# Patient Record
Sex: Male | Born: 1960 | Race: White | Hispanic: No | Marital: Single | Smoking: Current every day smoker
Health system: Southern US, Community
[De-identification: ages and names within clinical notes are randomized; demographics above are authoritative.]

## PROBLEM LIST (undated history)

## (undated) DIAGNOSIS — I10 Essential (primary) hypertension: Secondary | ICD-10-CM

## (undated) DIAGNOSIS — J449 Chronic obstructive pulmonary disease, unspecified: Secondary | ICD-10-CM

## (undated) DIAGNOSIS — F17200 Nicotine dependence, unspecified, uncomplicated: Secondary | ICD-10-CM

## (undated) HISTORY — PX: KNEE SURGERY: SHX244

## (undated) HISTORY — DX: Nicotine dependence, unspecified, uncomplicated: F17.200

## (undated) HISTORY — DX: Essential (primary) hypertension: I10

## (undated) HISTORY — DX: Chronic obstructive pulmonary disease, unspecified: J44.9

---

## 2013-10-15 ENCOUNTER — Emergency Department (INDEPENDENT_AMBULATORY_CARE_PROVIDER_SITE_OTHER): Payer: Self-pay

## 2013-10-15 ENCOUNTER — Encounter: Payer: Self-pay | Admitting: Emergency Medicine

## 2013-10-15 ENCOUNTER — Emergency Department
Admission: EM | Admit: 2013-10-15 | Discharge: 2013-10-15 | Disposition: A | Discharge status: Home / Self Care | Payer: Self-pay | Source: Home / Self Care | Attending: Emergency Medicine | Admitting: Emergency Medicine

## 2013-10-15 DIAGNOSIS — J209 Acute bronchitis, unspecified: Secondary | ICD-10-CM

## 2013-10-15 DIAGNOSIS — Z720 Tobacco use: Secondary | ICD-10-CM

## 2013-10-15 DIAGNOSIS — R509 Fever, unspecified: Secondary | ICD-10-CM

## 2013-10-15 DIAGNOSIS — R05 Cough: Secondary | ICD-10-CM

## 2013-10-15 LAB — POCT INFLUENZA A/B
INFLUENZA A, POC: NEGATIVE
INFLUENZA B, POC: NEGATIVE

## 2013-10-15 MED ORDER — AZITHROMYCIN 250 MG PO TABS: ORAL_TABLET | ORAL | Status: DC

## 2013-10-15 MED ORDER — HYDROCODONE-HOMATROPINE 5-1.5 MG/5ML PO SYRP: 5.0000 mL | ORAL_SOLUTION | Freq: Four times a day (QID) | ORAL | Status: DC | PRN

## 2013-10-15 NOTE — ED Notes (Signed)
Pt c/o cough, congestion, fever and body aches x 4 days.

## 2013-10-15 NOTE — Discharge Instructions (Signed)

## 2013-10-15 NOTE — ED Provider Notes (Signed)
CSN: [id number]     Arrival date & time 10/15/13  1209 History   First MD Initiated Contact with Patient 10/15/13 1242     Chief Complaint  Patient presents with  . Cough  . Fever   (Consider location/radiation/quality/duration/timing/severity/associated sxs/prior Treatment) Patient is a 53 y.o. male presenting with cough and fever. The history is provided by the patient. No language interpreter was used.  Cough Cough characteristics:  Productive Sputum characteristics:  Green Severity:  Moderate Timing:  Constant Progression:  Worsening Chronicity:  New Smoker: no   Context: upper respiratory infection   Worsened by:  Nothing tried Ineffective treatments:  None tried Associated symptoms: fever   Risk factors: no recent infection   Fever Associated symptoms: cough     History reviewed. No pertinent past medical history. Past Surgical History  Procedure Laterality Date  . Knee surgery Right    Family History  Problem Relation Age of Onset  . Family history unknown: Yes   History  Substance Use Topics  . Smoking status: Current Every Day Smoker -- 1.00 packs/day    Types: Cigarettes  . Smokeless tobacco: Not on file  . Alcohol Use: No    Review of Systems  Constitutional: Positive for fever.  Respiratory: Positive for cough.   All other systems reviewed and are negative.   Allergies  Review of patient's allergies indicates no known allergies.  Home Medications   Prior to Admission medications   Medication Sig Start Date End Date Taking? Authorizing Provider  albuterol (PROVENTIL HFA;VENTOLIN HFA) 108 (90 BASE) MCG/ACT inhaler Inhale into the lungs every 6 (six) hours as needed for wheezing or shortness of breath.   Yes Historical Provider, MD  benzonatate (TESSALON) 100 MG capsule Take by mouth 3 (three) times daily as needed for cough.   Yes Historical Provider, MD  ibuprofen (ADVIL,MOTRIN) 200 MG tablet Take 200 mg by mouth every 6 (six) hours as needed.    Yes Historical Provider, MD  lidocaine (XYLOCAINE) 2 % solution Use as directed 20 mLs in the mouth or throat as needed for mouth pain.   Yes Historical Provider, MD   BP 114/76  Pulse 75  Temp(Src) 98.1 F (36.7 C) (Oral)  Resp 16  Ht 6' (1.829 m)  Wt 249 lb (112.946 kg)  BMI 33.76 kg/m2  SpO2 96% Physical Exam  Nursing note and vitals reviewed. Constitutional: He is oriented to person, place, and time. He appears well-developed and well-nourished.  HENT:  Head: Normocephalic.  Eyes: EOM are normal. Pupils are equal, round, and reactive to light.  Neck: Normal range of motion.  Pulmonary/Chest: Effort normal. He exhibits tenderness.  Abdominal: Soft. He exhibits no distension.  Musculoskeletal: Normal range of motion.  Neurological: He is alert and oriented to person, place, and time.  Skin: Skin is warm.  Psychiatric: He has a normal mood and affect.    ED Course  Procedures (including critical care time) Labs Review Labs Reviewed - No data to display  Imaging Review Dg Chest 2 View  10/15/2013   CLINICAL DATA:  Cough, fever, and congestion for 3 days.  Smoker.  EXAM: CHEST  2 VIEW  COMPARISON:  None.  FINDINGS: The heart size and mediastinal contours are within normal limits. Both lungs are clear. The visualized skeletal structures are unremarkable.  IMPRESSION: No active cardiopulmonary disease.   Electronically Signed   By: Davonna BellingJohn  Curnes M.D.   On: 10/15/2013 13:15     MDM   1. Acute bronchitis, unspecified  organism    zithromax hydromet Continue albuterol Return if any problems.    Lonia SkinnerLeslie K Crystal LakeSofia, PA-C 10/15/13 1342

## 2013-10-18 NOTE — ED Provider Notes (Signed)
Medical history/examination/treatment/procedure(s) were performed by non-physician provider and as supervising physician I was immediately available for consultation/collaboration.  Lajean Manesavid Massey, MD 10/18/13 1124

## 2015-10-16 ENCOUNTER — Emergency Department
Admission: EM | Admit: 2015-10-16 | Discharge: 2015-10-16 | Disposition: A | Discharge status: Home / Self Care | Payer: Self-pay | Source: Home / Self Care | Attending: Family Medicine | Admitting: Family Medicine

## 2015-10-16 ENCOUNTER — Encounter: Payer: Self-pay | Admitting: Emergency Medicine

## 2015-10-16 DIAGNOSIS — R202 Paresthesia of skin: Secondary | ICD-10-CM

## 2015-10-16 DIAGNOSIS — R030 Elevated blood-pressure reading, without diagnosis of hypertension: Secondary | ICD-10-CM

## 2015-10-16 DIAGNOSIS — R51 Headache: Secondary | ICD-10-CM

## 2015-10-16 DIAGNOSIS — R200 Anesthesia of skin: Secondary | ICD-10-CM

## 2015-10-16 DIAGNOSIS — R519 Headache, unspecified: Secondary | ICD-10-CM

## 2015-10-16 LAB — COMPLETE METABOLIC PANEL WITH GFR
ALT: 17 U/L (ref 9–46)
AST: 17 U/L (ref 10–35)
Albumin: 4.1 g/dL (ref 3.6–5.1)
Alkaline Phosphatase: 59 U/L (ref 40–115)
BUN: 16 mg/dL (ref 7–25)
CO2: 23 mmol/L (ref 20–31)
Calcium: 9.4 mg/dL (ref 8.6–10.3)
Chloride: 105 mmol/L (ref 98–110)
Creat: 1.02 mg/dL (ref 0.70–1.33)
GFR, Est African American: >89 mL/min (ref >=60)
GFR, Est Non African American: 81 mL/min (ref >=60)
Glucose, Bld: 97 mg/dL (ref 65–99)
Potassium: 5 mmol/L (ref 3.5–5.3)
Sodium: 137 mmol/L (ref 135–146)
Total Bilirubin: 0.5 mg/dL (ref 0.2–1.2)
Total Protein: 6.7 g/dL (ref 6.1–8.1)

## 2015-10-16 LAB — POCT CBC W AUTO DIFF (K'VILLE URGENT CARE)

## 2015-10-16 LAB — POCT URINALYSIS DIP (MANUAL ENTRY)
Bilirubin, UA: NEGATIVE
Blood, UA: NEGATIVE
Glucose, UA: NEGATIVE
Ketones, POC UA: NEGATIVE
Leukocytes, UA: NEGATIVE
Nitrite, UA: NEGATIVE
Protein Ur, POC: NEGATIVE
Spec Grav, UA: 1.02 (ref 1.005–1.03)
Urobilinogen, UA: NEGATIVE (ref 0–1)
pH, UA: 5.5 (ref 5–8)

## 2015-10-16 LAB — TSH: TSH: 0.95 mIU/L (ref 0.40–4.50)

## 2015-10-16 MED ORDER — IBUPROFEN 600 MG PO TABS
600.0000 mg | ORAL_TABLET | Freq: Once | ORAL | Status: AC
  Administered 2015-10-16: 600 mg via ORAL

## 2015-10-16 MED ORDER — LISINOPRIL 10 MG PO TABS: 10.0000 mg | ORAL_TABLET | Freq: Every day | ORAL | 0 refills | Status: DC

## 2015-10-16 NOTE — ED Triage Notes (Signed)
Pt c/o HA, facial numbness and left arm pain that started x2 days ago. States pain is constant and he is having nausea. Denies chest pain.

## 2015-10-16 NOTE — ED Provider Notes (Signed)
CSN: [id number]     Arrival date & time 10/16/15  [id number] History   First MD Initiated Contact with Patient 10/16/15 0820     Chief Complaint  Patient presents with  . Headache   (Consider location/radiation/quality/duration/timing/severity/associated sxs/prior Treatment) HPI  Eric Baird is a 55 y.o. male presenting to UC with c/o Left sided facial numbness and Left arm numbness that started yesterday while he was watching the news, associated generalized headache that was slow in onset, mild to moderate in severity.  He did take ibuprofen yesterday, which helped with his headache.  He reports having a mild headache now but has not had anything for pain PTA. Denies weakness or pain to his face or arm.  He did have a mild head cold the other day but congestion has resolved.  No other symptoms. Denies dizziness, change in vision or balance. Denies chest pain or SOB.  Pt notes he knows he has elevated blood pressure due to readings at a pharmacy which told him he was "at risk" but he does not have a PCP. He is not on any daily medication. He has not f/u with a PCP in over 20 years. Pt is adopted so he does not know his family history.    No past medical history on file. Past Surgical History:  Procedure Laterality Date  . KNEE SURGERY Right    Family History  Problem Relation Age of Onset  . Family history unknown: Yes   Social History  Substance Use Topics  . Smoking status: Current Every Day Smoker    Packs/day: 1.00    Types: Cigarettes  . Smokeless tobacco: Not on file  . Alcohol use No    Review of Systems  Constitutional: Negative for chills and fever.  HENT: Negative for congestion, ear pain, sore throat, trouble swallowing and voice change.   Eyes: Negative for photophobia, pain and visual disturbance.  Respiratory: Negative for cough, chest tightness and shortness of breath.   Cardiovascular: Negative for chest pain and palpitations.  Gastrointestinal: Negative for  abdominal pain, diarrhea, nausea and vomiting.  Musculoskeletal: Negative for arthralgias, back pain, gait problem, myalgias, neck pain and neck stiffness.  Skin: Negative for rash.  Neurological: Positive for numbness and headaches. Negative for dizziness, syncope, speech difficulty, weakness and light-headedness.    Allergies  Review of patient's allergies indicates no known allergies.  Home Medications   Prior to Admission medications   Medication Sig Start Date End Date Taking? Authorizing Provider  albuterol (PROVENTIL HFA;VENTOLIN HFA) 108 (90 BASE) MCG/ACT inhaler Inhale into the lungs every 6 (six) hours as needed for wheezing or shortness of breath.    Historical Provider, MD  azithromycin (ZITHROMAX Z-PAK) 250 MG tablet Two tablets on day 1 then one tablet a day 10/15/13   Elson AreasLeslie K Sofia, PA-C  benzonatate (TESSALON) 100 MG capsule Take by mouth 3 (three) times daily as needed for cough.    Historical Provider, MD  HYDROcodone-homatropine (HYDROMET) 5-1.5 MG/5ML syrup Take 5 mLs by mouth every 6 (six) hours as needed for cough. 10/15/13   Elson AreasLeslie K Sofia, PA-C  ibuprofen (ADVIL,MOTRIN) 200 MG tablet Take 200 mg by mouth every 6 (six) hours as needed.    Historical Provider, MD  lidocaine (XYLOCAINE) 2 % solution Use as directed 20 mLs in the mouth or throat as needed for mouth pain.    Historical Provider, MD  lisinopril (PRINIVIL,ZESTRIL) 10 MG tablet Take 1 tablet (10 mg total) by mouth daily. 10/16/15   Denny PeonErin  Gershon Mussel, PA-C   Meds Ordered and Administered this Visit   Medications  ibuprofen (ADVIL,MOTRIN) tablet 600 mg (600 mg Oral Given 10/16/15 0858)    There were no vitals taken for this visit. No data found.   Physical Exam  Constitutional: He is oriented to person, place, and time. He appears well-developed and well-nourished. No distress.  Pt sitting on exam bed, appears well, NAD. Smiling, cooperative during exam.  HENT:  Head: Normocephalic and atraumatic.   Right Ear: Tympanic membrane normal.  Left Ear: Tympanic membrane normal.  Nose: Nose normal.  Mouth/Throat: Uvula is midline, oropharynx is clear and moist and mucous membranes are normal.  Eyes: Conjunctivae and EOM are normal. Pupils are equal, round, and reactive to light. No scleral icterus.  Neck: Normal range of motion. Neck supple.  No midline bone tenderness, no crepitus or step-offs.   Cardiovascular: Normal rate, regular rhythm, normal heart sounds and intact distal pulses.   Pulmonary/Chest: Effort normal and breath sounds normal. No respiratory distress. He has no wheezes. He has no rales.  Abdominal: Soft. He exhibits no distension and no mass. There is no tenderness.  Musculoskeletal: Normal range of motion.  Neurological: He is alert and oriented to person, place, and time. He has normal strength. No cranial nerve deficit or sensory deficit. He displays a negative Romberg sign. Coordination and gait normal. GCS eye subscore is 4. GCS verbal subscore is 5. GCS motor subscore is 6.  Reflex Scores:      Patellar reflexes are 2+ on the right side and 2+ on the left side. CN II-XII in tact. Speech is clear, alert to person, place and time. Normal finger to nose coordination. Normal gait.   Skin: Skin is warm and dry. He is not diaphoretic.  Nursing note and vitals reviewed.   Urgent Care Course   Clinical Course    Procedures (including critical care time)  Labs Review Labs Reviewed  COMPLETE METABOLIC PANEL WITH GFR  TSH  POCT CBC W AUTO DIFF ([facility])  POCT URINALYSIS DIP (MANUAL ENTRY)    Imaging Review No results found.   MDM   1. Numbness and tingling of left side of face   2. Left arm numbness   3. Generalized headache   4. Elevated blood pressure reading    Pt c/o numbness and tingling to Left side of face and Left arm that started yesterday, associated headache that was gradual in onset. Hx of headaches in the past. HA last night resolved  with ibuprofen. Ibuprofen given to pt per his request in UC for mild headache. BP is elevated. Pt is aware he has elevated BP but does not have a PCP.    Normal neuro exam today.  Doubt SAH, CVA, or other emergent process taking place at this time. CBC and UA: WNL CMP and TSH pending Will start pt on Lisinopril 10mg  daily- 20 tabs until he can f/u with a PCP. Encouraged to monitor his BP. Resource guide for PCP provided. Discussed symptoms that warrant emergent care in the ED. Pt info packet provided. Patient verbalized understanding and agreement with treatment plan.     Junius Finner, PA-C 10/16/15 [phone]

## 2015-10-17 ENCOUNTER — Telehealth: Payer: Self-pay | Admitting: Emergency Medicine

## 2015-10-17 NOTE — Telephone Encounter (Signed)
Home and mobile numbers do not work, called work number and he is out of office.

## 2015-10-20 ENCOUNTER — Telehealth: Payer: Self-pay | Admitting: Emergency Medicine

## 2015-10-20 NOTE — Telephone Encounter (Signed)
Patient called in gave me the correct mobile number, labs normal gave him phone number to establish care with Primary Care

## 2015-11-03 ENCOUNTER — Ambulatory Visit (INDEPENDENT_AMBULATORY_CARE_PROVIDER_SITE_OTHER): Payer: Self-pay | Admitting: Osteopathic Medicine

## 2015-11-03 ENCOUNTER — Encounter: Payer: Self-pay | Admitting: Osteopathic Medicine

## 2015-11-03 VITALS — BP 125/85 | HR 75 | Ht 72.0 in | Wt 246.0 lb

## 2015-11-03 DIAGNOSIS — F17200 Nicotine dependence, unspecified, uncomplicated: Secondary | ICD-10-CM

## 2015-11-03 DIAGNOSIS — F17209 Nicotine dependence, unspecified, with unspecified nicotine-induced disorders: Secondary | ICD-10-CM | POA: Insufficient documentation

## 2015-11-03 DIAGNOSIS — I10 Essential (primary) hypertension: Secondary | ICD-10-CM

## 2015-11-03 HISTORY — DX: Essential (primary) hypertension: I10

## 2015-11-03 HISTORY — DX: Nicotine dependence, unspecified, uncomplicated: F17.200

## 2015-11-03 MED ORDER — BUPROPION HCL ER (SR) 150 MG PO TB12: 150.0000 mg | ORAL_TABLET | Freq: Two times a day (BID) | ORAL | 1 refills | Status: DC

## 2015-11-03 MED ORDER — LISINOPRIL 10 MG PO TABS: 10.0000 mg | ORAL_TABLET | Freq: Every day | ORAL | 1 refills | Status: DC

## 2015-11-03 NOTE — Progress Notes (Signed)
HPI: Eric BelfastRichard Baird is a 55 y.o. male  [facility], 11/[facility]/15,  for chief complaint of:  Chief Complaint  Patient presents with  . Establish Care    New patient here to establish care, referred from urgent care  Hypertension: Patient recently started on lisinopril by urgent care, blood pressure is under good control today. Denies chest pain, pressure, shortness of breath, vision changes, headache.  Tobacco dependence: Patient is interested in quitting, nicotine replacement therapy has not been helpful, he has been able to stop cold [place] but always seems to restart shortly thereafter. He is open to medication augmentation.  Pulmonary: Occasional use of albuterol, no known diagnosis of COPD/asthma, albuterol was prescribed for acute illness at some point per patient. He reports no difficulty breathing which inhibits his day-to-day activities, no shortness of breath likely   Past medical, surgical, social and family history reviewed: No past medical history on file. Past Surgical History:  Procedure Laterality Date  . KNEE SURGERY Right    Social History  Substance Use Topics  . Smoking status: Current Every Day Smoker    Packs/day: 1.00    Types: Cigarettes  . Smokeless tobacco: Never Used  . Alcohol use No   Family History  Problem Relation Age of Onset  . Family history unknown: Yes     Current medication list and allergy/intolerance information reviewed:   Current Outpatient Prescriptions on File Prior to Visit  Medication Sig Dispense Refill  . albuterol (PROVENTIL HFA;VENTOLIN HFA) 108 (90 BASE) MCG/ACT inhaler Inhale into the lungs every 6 (six) hours as needed for wheezing or shortness of breath.    . lisinopril (PRINIVIL,ZESTRIL) 10 MG tablet Take 1 tablet (10 mg total) by mouth daily. 20 tablet 0   No current facility-administered medications on file prior to visit.    No Known Allergies    Review of  Systems:  Constitutional: No recent illness  HEENT: No  headache, no vision change  Cardiac: No  chest pain, No  pressure, No palpitations  Respiratory:  No  shortness of breath. No  Cough  Gastrointestinal: No  abdominal pain, no change on bowel habits  Musculoskeletal: No new myalgia/arthralgia  Skin: No  Rash  Hem/Onc: No  easy bruising/bleeding, No  abnormal lumps/bumps  Neurologic: No  weakness, No  Dizziness  Psychiatric: No  concerns with depression, No  concerns with anxiety  Exam:  BP 125/85   Pulse 75   Ht 6' (1.829 m)   Wt 246 lb (111.6 kg)   BMI 33.36 kg/m   Constitutional: VS see above. General Appearance: alert, well-developed, well-nourished, NAD  Eyes: Normal lids and conjunctive, non-icteric sclera  Ears, Nose, Mouth, Throat: MMM, Normal external inspection ears/nares/mouth/lips/gums.  Neck: No masses, trachea midline.   Respiratory: Normal respiratory effort. no wheeze, no rhonchi, no rales  Cardiovascular: S1/S2 normal, no murmur, no rub/gallop auscultated. RRR.   Musculoskeletal: Gait normal. Symmetric and independent movement of all extremities  Neurological: Normal balance/coordination. No tremor.  Skin: warm, dry, intact.   Psychiatric: Normal judgment/insight. Normal mood and affect. Oriented x3.      ASSESSMENT/PLAN: Advised we need labs to follow kidney function on new blood pressure medication and for her sugar for diabetes screening, also need cholesterol screening. Patient will come back for fasting labs. Discussed Chantix versus Wellbutrin as good options for him for medication management to help quit smoking, unfortunately due to lack of insurance Chantix is out of his reach at  this time opts to try Wellbutrin, if not successful and this will contact drug rep to see if there is any savings program which may benefit him. Patient has quit date set for Christmas this year  Essential hypertension - Plan: Lipid panel, BASIC METABOLIC  PANEL WITH GFR, lisinopril (PRINIVIL,ZESTRIL) 10 MG tablet  Tobacco dependence - Plan: buPROPion (WELLBUTRIN SR) 150 MG 12 hr tablet     Visit summary with medication list and pertinent instructions was printed for patient to review. All questions at time of visit were answered - patient instructed to contact office with any additional concerns. ER/RTC precautions were reviewed with the patient. Follow-up plan: Return for visit w/ Dr Lyn HollingsheadAlexander in 1 year for Annual Check-up and refills, visit in 6 months with nurse for BP.  Note: Total time spent 30 minutes, greater than 50% of the visit was spent face-to-face counseling and coordinating care for the following: The primary encounter diagnosis was Essential hypertension. A diagnosis of Tobacco dependence was also pertinent to this visit..[name]

## 2015-11-08 LAB — LIPID PANEL
CHOLESTEROL: 203 mg/dL — AB (ref 125–200)
HDL: 36 mg/dL — AB (ref >=40)
LDL Cholesterol: 147 mg/dL — ABNORMAL HIGH (ref <130)
Total CHOL/HDL Ratio: 5.6 Ratio — ABNORMAL HIGH (ref <=5.0)
Triglycerides: 102 mg/dL (ref <150)
VLDL: 20 mg/dL (ref <30)

## 2015-11-08 LAB — BASIC METABOLIC PANEL WITH GFR
BUN: 11 mg/dL (ref 7–25)
CALCIUM: 9.5 mg/dL (ref 8.6–10.3)
CHLORIDE: 106 mmol/L (ref 98–110)
CO2: 23 mmol/L (ref 20–31)
Creat: 1.18 mg/dL (ref 0.70–1.33)
GFR, Est African American: 79 mL/min (ref >=60)
GFR, Est Non African American: 68 mL/min (ref >=60)
GLUCOSE: 81 mg/dL (ref 65–99)
Potassium: 4.4 mmol/L (ref 3.5–5.3)
Sodium: 137 mmol/L (ref 135–146)

## 2015-12-16 ENCOUNTER — Ambulatory Visit (INDEPENDENT_AMBULATORY_CARE_PROVIDER_SITE_OTHER): Payer: Self-pay | Admitting: Family Medicine

## 2015-12-16 ENCOUNTER — Encounter: Payer: Self-pay | Admitting: Family Medicine

## 2015-12-16 VITALS — BP 120/75 | HR 72 | Temp 98.0°F | Wt 245.0 lb

## 2015-12-16 DIAGNOSIS — J209 Acute bronchitis, unspecified: Secondary | ICD-10-CM

## 2015-12-16 DIAGNOSIS — F17200 Nicotine dependence, unspecified, uncomplicated: Secondary | ICD-10-CM

## 2015-12-16 DIAGNOSIS — Z23 Encounter for immunization: Secondary | ICD-10-CM

## 2015-12-16 DIAGNOSIS — J019 Acute sinusitis, unspecified: Secondary | ICD-10-CM

## 2015-12-16 MED ORDER — DOXYCYCLINE HYCLATE 100 MG PO TABS: 100.0000 mg | ORAL_TABLET | Freq: Two times a day (BID) | ORAL | 0 refills | Status: DC

## 2015-12-16 MED ORDER — PREDNISONE 20 MG PO TABS: 40.0000 mg | ORAL_TABLET | Freq: Every day | ORAL | 0 refills | Status: DC

## 2015-12-16 NOTE — Patient Instructions (Signed)
With the albuterol can use 2-4 puffs every 6 hours as needed. Call if not better in one week.

## 2015-12-16 NOTE — Progress Notes (Signed)
   Subjective:    Patient ID: Eric Baird, male    DOB: 04/10/1960, 55 y.o.   MRN: [id number]  HPI 55 year old male with a history of hypertension and tobacco abuse comes in today with chest and nasal congestion 3 days. Infectious started running a fever this morning. He did not measure it but says he was feeling hot to touch but yet had chills. He took some Advil. He is a very heavy smoker normally smokes 2 packs a day but more recently on the Wellbutrin he's been able to cut back down to one pack a day.  He has not had a flu vaccine this year. He denies any sore throat or headache. He just feels like he is getting worse and is actually feeling dizzy and nauseated this morning. No diarrhea or loose stools.   Review of Systems     Objective:   Physical Exam  Constitutional: He is oriented to person, place, and time. He appears well-developed and well-nourished.  HENT:  Head: Normocephalic and atraumatic.  Right Ear: External ear normal.  Left Ear: External ear normal.  Nose: Nose normal.  Mouth/Throat: Oropharynx is clear and moist.  TMs and canals are clear.   Eyes: Conjunctivae and EOM are normal. Pupils are equal, round, and reactive to light.  Neck: Neck supple. No thyromegaly present.  Cardiovascular: Normal rate and normal heart sounds.   Pulmonary/Chest: Effort normal. He has wheezes.  Diffuse wheezing and rhonchi.   Lymphadenopathy:    He has no cervical adenopathy.  Neurological: He is alert and oriented to person, place, and time.  Skin: Skin is warm and dry. No rash noted.  Psychiatric: He has a normal mood and affect. His behavior is normal.       Assessment & Plan:  Acute sinusitis/bronchitis-I strongly suspect he has some underlying COPD. Has some significant wheezing on exam today. Thus we'll treat with doxycycline and prednisone almost like a COPD exacerbation. We'll also give him an albuterol sample. Used 2-4 puffs every 6 hours as needed. If he is not feeling  much better after week then he needs to come in for follow-up. He really needs to be scheduled for spirometry sometime in January to evaluate for underlying COPD.  Tobacco abuse-he is working on cutting back.

## 2016-01-13 ENCOUNTER — Ambulatory Visit (INDEPENDENT_AMBULATORY_CARE_PROVIDER_SITE_OTHER): Payer: Self-pay | Admitting: Osteopathic Medicine

## 2016-01-13 VITALS — BP 109/85 | HR 79 | Ht 72.0 in | Wt 251.0 lb

## 2016-01-13 DIAGNOSIS — J449 Chronic obstructive pulmonary disease, unspecified: Secondary | ICD-10-CM

## 2016-01-13 DIAGNOSIS — F17200 Nicotine dependence, unspecified, uncomplicated: Secondary | ICD-10-CM

## 2016-01-13 DIAGNOSIS — J40 Bronchitis, not specified as acute or chronic: Secondary | ICD-10-CM

## 2016-01-13 MED ORDER — BUPROPION HCL ER (XL) 300 MG PO TB24: 300.0000 mg | ORAL_TABLET | Freq: Every day | ORAL | 1 refills | Status: DC

## 2016-01-13 MED ORDER — ALBUTEROL SULFATE (2.5 MG/3ML) 0.083% IN NEBU
2.5000 mg | INHALATION_SOLUTION | Freq: Once | RESPIRATORY_TRACT | Status: AC
  Administered 2016-01-13: 2.5 mg via RESPIRATORY_TRACT

## 2016-01-13 NOTE — Patient Instructions (Addendum)
Steps to Quit Smoking Smoking tobacco can be harmful to your health and can affect almost every organ in your body. Smoking puts you, and those around you, at risk for developing many serious chronic diseases. Quitting smoking is difficult, but it is one of the best things that you can do for your health. It is never too late to quit. What are the benefits of quitting smoking? When you quit smoking, you lower your risk of developing serious diseases and conditions, such as:  Lung cancer or lung disease, such as COPD.  Heart disease.  Stroke.  Heart attack.  Infertility.  Osteoporosis and bone fractures. Additionally, symptoms such as coughing, wheezing, and shortness of breath may get better when you quit. You may also find that you get sick less often because your body is stronger at fighting off colds and infections. If you are pregnant, quitting smoking can help to reduce your chances of having a baby of low birth weight. How do I get ready to quit? When you decide to quit smoking, create a plan to make sure that you are successful. Before you quit:  Pick a date to quit. Set a date within the next two weeks to give you time to prepare.  Write down the reasons why you are quitting. Keep this list in places where you will see it often, such as on your bathroom mirror or in your car or wallet.  Identify the people, places, things, and activities that make you want to smoke (triggers) and avoid them. Make sure to take these actions:  Throw away all cigarettes at home, at work, and in your car.  Throw away smoking accessories, such as Scientist, research (medical).  Clean your car and make sure to empty the ashtray.  Clean your home, including curtains and carpets.  Tell your family, friends, and coworkers that you are quitting. Support from your loved ones can make quitting easier.  Talk with your health care provider about your options for quitting smoking.  Find out what treatment  options are covered by your health insurance. What strategies can I use to quit smoking? Talk with your healthcare provider about different strategies to quit smoking. Some strategies include:  Quitting smoking altogether instead of gradually lessening how much you smoke over a period of time. Research shows that quitting "cold [place]" is more successful than gradually quitting.  Attending in-person counseling to help you build problem-solving skills. You are more likely to have success in quitting if you attend several counseling sessions. Even short sessions of 10 minutes can be effective.  Finding resources and support systems that can help you to quit smoking and remain smoke-free after you quit. These resources are most helpful when you use them often. They can include:  Online chats with a Social worker.  Telephone quitlines.  Printed Furniture conservator/restorer.  Support groups or group counseling.  Text messaging programs.  Mobile phone applications.  Taking medicines to help you quit smoking. (If you are pregnant or breastfeeding, talk with your health care provider first.) Some medicines contain nicotine and some do not. Both types of medicines help with cravings, but the medicines that include nicotine help to relieve withdrawal symptoms. Your health care provider may recommend:  Nicotine patches, gum, or lozenges.  Nicotine inhalers or sprays.  Non-nicotine medicine that is taken by mouth. Talk with your health care provider about combining strategies, such as taking medicines while you are also receiving in-person counseling. Using these two strategies together makes you more  likely to succeed in quitting than if you used either strategy on its own. If you are pregnant or breastfeeding, talk with your health care provider about finding counseling or other support strategies to quit smoking. Do not take medicine to help you quit smoking unless told to do so by your health care  provider. What things can I do to make it easier to quit? Quitting smoking might feel overwhelming at first, but there is a lot that you can do to make it easier. Take these important actions:  Reach out to your family and friends and ask that they support and encourage you during this time. Call telephone quitlines, reach out to support groups, or work with a counselor for support.  Ask people who smoke to avoid smoking around you.  Avoid places that trigger you to smoke, such as bars, parties, or smoke-break areas at work.  Spend time around people who do not smoke.  Lessen stress in your life, because stress can be a smoking trigger for some people. To lessen stress, try:  Exercising regularly.  Deep-breathing exercises.  Yoga.  Meditating.  Performing a body scan. This involves closing your eyes, scanning your body from head to toe, and noticing which parts of your body are particularly tense. Purposefully relax the muscles in those areas.  Download or purchase mobile phone or tablet apps (applications) that can help you stick to your quit plan by providing reminders, tips, and encouragement. There are many free apps, such as QuitGuide from the [employer] developer for Disease Control and Prevention). You can find other support for quitting smoking (smoking cessation) through smokefree.gov and other websites. How will I feel when I quit smoking? Within the first 24 hours of quitting smoking, you may start to feel some withdrawal symptoms. These symptoms are usually most noticeable 2-3 days after quitting, but they usually do not last beyond 2-3 weeks. Changes or symptoms that you might experience include:  Mood swings.  Restlessness, anxiety, or irritation.  Difficulty concentrating.  Dizziness.  Strong cravings for sugary foods in addition to nicotine.  Mild weight gain.  Constipation.  Nausea.  Coughing or a sore throat.  Changes in how your medicines work in your  body.  A depressed mood.  Difficulty sleeping (insomnia). After the first 2-3 weeks of quitting, you may start to notice more positive results, such as:  Improved sense of smell and taste.  Decreased coughing and sore throat.  Slower heart rate.  Lower blood pressure.  Clearer skin.  The ability to breathe more easily.  Fewer sick days. Quitting smoking is very challenging for most people. Do not get discouraged if you are not successful the first time. Some people need to make many attempts to quit before they achieve long-term success. Do your best to stick to your quit plan, and talk with your health care provider if you have any questions or concerns. This information is not intended to replace advice given to you by your health care provider. Make sure you discuss any questions you have with your health care provider. Document Released: 12/15/2000 Document Revised: 08/19/2015 Document Reviewed: 05/07/2014 Elsevier Interactive Patient Education  2017 Elsevier Inc.   Chronic Obstructive Pulmonary Disease Chronic obstructive pulmonary disease (COPD) is a common lung condition in which airflow from the lungs is limited. COPD is a general term that can be used to describe many different lung problems that limit airflow, including both chronic bronchitis and emphysema. If you have COPD, your lung function will  probably never return to normal, but there are measures you can take to improve lung function and make yourself feel better. What are the causes?  Smoking (common).  Exposure to secondhand smoke.  Genetic problems.  Chronic inflammatory lung diseases or recurrent infections. What are the signs or symptoms?  Shortness of breath, especially with physical activity.  Deep, persistent (chronic) cough with a large amount of thick mucus.  Wheezing.  Rapid breaths (tachypnea).  Gray or bluish discoloration (cyanosis) of the skin, especially in your fingers, toes, or  lips.  Fatigue.  Weight loss.  Frequent infections or episodes when breathing symptoms become much worse (exacerbations).  Chest tightness. How is this diagnosed? Your health care provider will take a medical history and perform a physical examination to diagnose COPD. Additional tests for COPD may include:  Lung (pulmonary) function tests.  Chest X-ray.  CT scan.  Blood tests. How is this treated? Treatment for COPD may include:  Inhaler and nebulizer medicines. These help manage the symptoms of COPD and make your breathing more comfortable.  Supplemental oxygen. Supplemental oxygen is only helpful if you have a low oxygen level in your blood.  Exercise and physical activity. These are beneficial for nearly all people with COPD.  Lung surgery or transplant.  Nutrition therapy to gain weight, if you are underweight.  Pulmonary rehabilitation. This may involve working with a team of health care providers and specialists, such as respiratory, occupational, and physical therapists. Follow these instructions at home:  Take all medicines (inhaled or pills) as directed by your health care provider.  Avoid over-the-counter medicines or cough syrups that dry up your airway (such as antihistamines) and slow down the elimination of secretions unless instructed otherwise by your health care provider.  If you are a smoker, the most important thing that you can do is stop smoking. Continuing to smoke will cause further lung damage and breathing trouble. Ask your health care provider for help with quitting smoking. He or she can direct you to community resources or hospitals that provide support.  Avoid exposure to irritants such as smoke, chemicals, and fumes that aggravate your breathing.  Use oxygen therapy and pulmonary rehabilitation if directed by your health care provider. If you require home oxygen therapy, ask your health care provider whether you should purchase a pulse  oximeter to measure your oxygen level at home.  Avoid contact with individuals who have a contagious illness.  Avoid extreme temperature and humidity changes.  Eat healthy foods. Eating smaller, more frequent meals and resting before meals may help you maintain your strength.  Stay active, but balance activity with periods of rest. Exercise and physical activity will help you maintain your ability to do things you want to do.  Preventing infection and hospitalization is very important when you have COPD. Make sure to receive all the vaccines your health care provider recommends, especially the pneumococcal and influenza vaccines. Ask your health care provider whether you need a pneumonia vaccine.  Learn and use relaxation techniques to manage stress.  Learn and use controlled breathing techniques as directed by your health care provider. Controlled breathing techniques include: 1. Pursed lip breathing. Start by breathing in (inhaling) through your nose for 1 second. Then, purse your lips as if you were going to whistle and breathe out (exhale) through the pursed lips for 2 seconds. 2. Diaphragmatic breathing. Start by putting one hand on your abdomen just above your waist. Inhale slowly through your nose. The hand on your abdomen  should move out. Then purse your lips and exhale slowly. You should be able to feel the hand on your abdomen moving in as you exhale.  Learn and use controlled coughing to clear mucus from your lungs. Controlled coughing is a series of short, progressive coughs. The steps of controlled coughing are: 1. Lean your head slightly forward. 2. Breathe in deeply using diaphragmatic breathing. 3. Try to hold your breath for 3 seconds. 4. Keep your mouth slightly open while coughing twice. 5. Spit any mucus out into a tissue. 6. Rest and repeat the steps once or twice as needed. Contact a health care provider if:  You are coughing up more mucus than usual.  There is a  change in the color or thickness of your mucus.  Your breathing is more labored than usual.  Your breathing is faster than usual. Get help right away if:  You have shortness of breath while you are resting.  You have shortness of breath that prevents you from:  Being able to talk.  Performing your usual physical activities.  You have chest pain lasting longer than 5 minutes.  Your skin color is more cyanotic than usual.  You measure low oxygen saturations for longer than 5 minutes with a pulse oximeter. This information is not intended to replace advice given to you by your health care provider. Make sure you discuss any questions you have with your health care provider. Document Released: 09/30/2004 Document Revised: 05/29/2015 Document Reviewed: 08/17/2012 Elsevier Interactive Patient Education  2017 [employer].

## 2016-01-13 NOTE — Progress Notes (Signed)
HPI: Eric Baird is a 56 y.o. male  who [facility] [place] today, 01/13/16,  for chief complaint of:  Chief Complaint  Patient presents with  . Cough    tobacco abuse, recent wheezing on exam/bronchitis     . Context:Recently seen in the office for acute for any sinus/bronchitis with suspected underlying COPD and was advised to return for pulmonary function test. . Quality: Chronic cough but no significant shortness of breath otherwise . Duration: Smoker for many years . Modifying factors: Lack of insurance, persistent tobacco abuse that he has been able to cut down some on the Wellbutrin   Past medical, surgical, social and family history reviewed: Patient Active Problem List   Diagnosis Date Noted  . Essential hypertension 11/03/2015  . Tobacco dependence 11/03/2015   Past Surgical History:  Procedure Laterality Date  . KNEE SURGERY Right    Social History  Substance Use Topics  . Smoking status: Current Every Day Smoker    Packs/day: 1.00    Types: Cigarettes  . Smokeless tobacco: Never Used  . Alcohol use No   Family History  Problem Relation Age of Onset  . Family history unknown: Yes     Current medication list and allergy/intolerance information reviewed:   Current Outpatient Prescriptions on File Prior to Visit  Medication Sig Dispense Refill  . albuterol (PROVENTIL HFA;VENTOLIN HFA) 108 (90 BASE) MCG/ACT inhaler Inhale into the lungs every 6 (six) hours as needed for wheezing or shortness of breath.    [name]. buPROPion (WELLBUTRIN SR) 150 MG 12 hr tablet Take 1 tablet (150 mg total) by mouth 2 (two) times daily. Start with 1 tablet (150 mg total) once per day for 3 days 90 tablet 1  . doxycycline (VIBRA-TABS) 100 MG tablet Take 1 tablet (100 mg total) by mouth 2 (two) times daily. 20 tablet 0  . lisinopril (PRINIVIL,ZESTRIL) 10 MG tablet Take 1 tablet (10 mg total) by mouth daily. 90 tablet 1   No current  facility-administered medications on file prior to visit.    No Known Allergies    Review of Systems:  Constitutional: +recent illness resolved  HEENT: No  headache, no vision change  Cardiac: No  chest pain, No  pressure, No palpitations  Respiratory:  No  shortness of breath. +chronic nagging Cough  Neurologic: No  weakness, No  Dizziness  Exam:  BP 109/85   Pulse 79   Ht 6' (1.829 m)   Wt 251 lb (113.9 kg)   BMI 34.04 kg/m   Constitutional: VS see above. General Appearance: alert, well-developed, well-nourished, NAD  Eyes: Normal lids and conjunctive, non-icteric sclera  Ears, Nose, Mouth, Throat: MMM, Normal external inspection ears/nares/mouth/lips/gums.  Neck: No masses, trachea midline.   Respiratory: Normal respiratory effort. no wheeze, no rhonchi, no rales, diminished breath sounds  Cardiovascular: S1/S2 normal, no murmur, no rub/gallop auscultated. RRR.   Musculoskeletal: Gait normal. Symmetric and independent movement of all extremities  Neurological: Normal balance/coordination. No tremor.  Skin: warm, dry, intact.   Psychiatric: Normal judgment/insight. Normal mood and affect. Oriented x3.    PFT INTERPRETATION  FEV1/FVC >70 *AND*  FEV1 >80% predicted  = NORMAL SPIROMETRY NORMAL? no  1. Valid study? yes 2. Flow-Volume Loop: borderline 3. FEV1/FVC: <70 on post-test 4. Severity of Obstruction ~ Post-Bronchodilator FEV1:  FEV1 50-79 = GOLD Stage II = Moderate 5. Bronchodilator Challenge  Increase FEV1 or FVC by 200+mL? no *AND*  Increased same FEV1 or FVC by 12+%? no  Reversible? no 6. FVC <80% = Restrictive no 7. Lung Volumes  Unable to assess w/ office equipment 8. Diffusion Capacity - refer to Pulm needed? no If suspect interstitial lung disease    ASSESSMENT/PLAN:  Counseled on abnormal spirometry, patient looks like actually got worse with post bronchodilator administration, overall he states general quality of life is not impacted  by breathing problems he would rather not be on inhaled medications at this time but will see how he does as quitting smoking and may reevaluate this in the future. Advised annual flu shot and pneumonia vaccine  Stage 2 moderate COPD by GOLD classification (HCC) - Declines inhaler therapy due to cost and states that overall he feels he is breathing well and just needs to quit smoking.  Bronchitis - Plan: Spirometry: Pre & Post Eval, albuterol (PROVENTIL) (2.5 MG/3ML) 0.083% nebulizer solution 2.5 mg  Tobacco dependence - Plan: buPROPion (WELLBUTRIN XL) 300 MG 24 hr tablet     Visit summary with medication list and pertinent instructions was printed for patient to review. All questions at time of visit were answered - patient instructed to contact office with any additional concerns. ER/RTC precautions were reviewed with the patient. Follow-up plan: Return in about 6 months (around 07/12/2016) for annual physical, recheck breathing.  Note: Total time spent 25 minutes, greater than 50% of the visit was spent face-to-face counseling and coordinating care for the following: The primary encounter diagnosis was Stage 2 moderate COPD by GOLD classification (HCC). Diagnoses of SOB (shortness of breath) and Tobacco dependence were also pertinent to this visit.[name]

## 2016-01-14 DIAGNOSIS — J449 Chronic obstructive pulmonary disease, unspecified: Secondary | ICD-10-CM | POA: Insufficient documentation

## 2016-01-14 HISTORY — DX: Chronic obstructive pulmonary disease, unspecified: J44.9

## 2016-03-18 ENCOUNTER — Telehealth: Payer: Self-pay

## 2016-03-18 DIAGNOSIS — F17200 Nicotine dependence, unspecified, uncomplicated: Secondary | ICD-10-CM

## 2016-03-18 MED ORDER — BUPROPION HCL ER (XL) 300 MG PO TB24: 300.0000 mg | ORAL_TABLET | Freq: Every day | ORAL | 1 refills | Status: DC

## 2016-03-18 NOTE — Telephone Encounter (Signed)
Patient request refill for Wellbutrin 300 mg # 30 1 refill sent to pharmacy. Rhonda Cunningham,CMA

## 2016-05-03 ENCOUNTER — Ambulatory Visit (INDEPENDENT_AMBULATORY_CARE_PROVIDER_SITE_OTHER): Payer: Self-pay | Admitting: Osteopathic Medicine

## 2016-05-03 VITALS — BP 124/86 | HR 95 | Resp 16 | Wt 257.0 lb

## 2016-05-03 DIAGNOSIS — F17200 Nicotine dependence, unspecified, uncomplicated: Secondary | ICD-10-CM

## 2016-05-03 DIAGNOSIS — I10 Essential (primary) hypertension: Secondary | ICD-10-CM

## 2016-05-03 NOTE — Progress Notes (Signed)
   Subjective:    Patient ID: Eric Baird, male    DOB: 07/25/1960, 56 y.o.   MRN: [id number]  HPI Patient here for BP follow up; did not take lisinopril today because he ran out. He is smoking around 1ppd; gets a headache when he smokes and considers this a good deterrent; no insurance so he cannot afford Chantex. He has additional questions: his ears feel full and strange in different positions; he is bruising on arms more frequently, but denies rectal, urinary, epistaxis bleeding or any other body areas with more bruising.    Review of Systems     Objective:   Physical Exam        Assessment & Plan:  Because of additional concerns and today's BP which is higher than last visit, he will make appt.to discuss these things next week. Ear exam: could visualize both TMs without obvious cerumen impaction.

## 2016-05-04 ENCOUNTER — Other Ambulatory Visit: Payer: Self-pay | Admitting: Osteopathic Medicine

## 2016-05-04 DIAGNOSIS — I10 Essential (primary) hypertension: Secondary | ICD-10-CM

## 2016-05-04 NOTE — Progress Notes (Signed)
BP 124/86 (BP Location: Right Arm, Patient Position: Sitting, Cuff Size: Large) Comment: did not take lisinopril today; he is out of supply  Pulse 95   Resp 16   Wt 257 lb (116.6 kg)   SpO2 97%   BMI 34.86 kg/m   Reviewed, blood pressure okay by diastolic a bit on the high side. Agree that patient needs follow-up to address multiple other symptoms

## 2016-05-11 ENCOUNTER — Encounter: Payer: Self-pay | Admitting: Osteopathic Medicine

## 2016-05-11 ENCOUNTER — Ambulatory Visit (INDEPENDENT_AMBULATORY_CARE_PROVIDER_SITE_OTHER): Payer: Self-pay | Admitting: Osteopathic Medicine

## 2016-05-11 VITALS — BP 128/85 | HR 67 | Wt 259.0 lb

## 2016-05-11 DIAGNOSIS — I10 Essential (primary) hypertension: Secondary | ICD-10-CM

## 2016-05-11 DIAGNOSIS — R233 Spontaneous ecchymoses: Secondary | ICD-10-CM

## 2016-05-11 DIAGNOSIS — R238 Other skin changes: Secondary | ICD-10-CM

## 2016-05-11 DIAGNOSIS — F17200 Nicotine dependence, unspecified, uncomplicated: Secondary | ICD-10-CM

## 2016-05-11 DIAGNOSIS — J449 Chronic obstructive pulmonary disease, unspecified: Secondary | ICD-10-CM

## 2016-05-11 DIAGNOSIS — H6981 Other specified disorders of Eustachian tube, right ear: Secondary | ICD-10-CM

## 2016-05-11 MED ORDER — LORATADINE 10 MG PO TABS: 10.0000 mg | ORAL_TABLET | Freq: Two times a day (BID) | ORAL | 0 refills | Status: DC

## 2016-05-11 MED ORDER — AMOXICILLIN-POT CLAVULANATE 875-125 MG PO TABS: 1.0000 | ORAL_TABLET | Freq: Two times a day (BID) | ORAL | 0 refills | Status: DC

## 2016-05-11 MED ORDER — IPRATROPIUM BROMIDE 0.06 % NA SOLN: 2.0000 | Freq: Four times a day (QID) | NASAL | 0 refills | Status: DC

## 2016-05-11 NOTE — Progress Notes (Signed)
HPI: Eric Baird is a 56 y.o. male  who presents to [facility] today, 05/11/16,  for chief complaint of:  Chief Complaint  Patient presents with  . Follow-up    BLOOD PRESSURE    Recently here for BP nurse check - was not taking Lisinopril that day. Other complaints required follow-up so he is here for office visit.   Hypertension: Fairly well controlled on lisinopril, diastolic blood pressure is a little bit elevated as below, no chest pain, pressure, shortness of breath.  Ears: fullness in different positions. Worse with bending forward. Worse on right side. No hearing change, no headache. No history of seasonal allergy problems. Has not tried any over-the-counter medications or other home remedies. Ongoing for about a month  Bruising easily: Ongoing several months, not severe, just a bit curious about this. No hematuria, no rectal bleeding, no bleeding from gums or epistaxis  Smoking: gets headaches with cigarettes, has cut back a great deal. Has tapered himself off of the Wellbutrin   Past medical history, surgical history, social history and family history reviewed.  Patient Active Problem List   Diagnosis Date Noted  . Stage 2 moderate COPD by GOLD classification (HCC) 01/14/2016  . Essential hypertension 11/03/2015  . Tobacco dependence 11/03/2015    Current medication list and allergy/intolerance information reviewed.   Current Outpatient Prescriptions on File Prior to Visit  Medication Sig Dispense Refill  . albuterol (PROVENTIL HFA;VENTOLIN HFA) 108 (90 BASE) MCG/ACT inhaler Inhale into the lungs every 6 (six) hours as needed for wheezing or shortness of breath.    [name] buPROPion (WELLBUTRIN XL) 300 MG 24 hr tablet Take 1 tablet (300 mg total) by mouth daily. 30 tablet 1  . lisinopril (PRINIVIL,ZESTRIL) 10 MG tablet TAKE 1 TABLET(10 MG) BY MOUTH DAILY 90 tablet 0   No current facility-administered medications on file prior to visit.     No Known Allergies    Review of Systems:  Constitutional: No recent illness  HEENT: No  headache, no vision change  Cardiac: No  chest pain, No  pressure, No palpitations  Respiratory:  No  shortness of breath. No  Cough  Musculoskeletal: No new myalgia/arthralgia  Skin: No  Rash  Hem/Onc: +easy bruising/bleeding on skin, no bleeding gums/rectal/urine, No  abnormal lumps/bumps  Psychiatric: No  concerns with depression, No  concerns with anxiety  Exam:  BP 128/85   Pulse 67   Wt 259 lb (117.5 kg)   BMI 35.13 kg/m   Constitutional: VS see above. General Appearance: alert, well-developed, well-nourished, NAD  Eyes: Normal lids and conjunctive, non-icteric sclera  Ears, Nose, Mouth, Throat: MMM, Normal external inspection ears/nares/mouth/lips/gums. Normal tympanic membrane on left. Right tympanic membrane demonstrates clear effusion behind membrane, no dullness/bulging, canals normal on both sides.  Neck: No masses, trachea midline.   Respiratory: Normal respiratory effort. no wheeze, no rhonchi, no rales  Cardiovascular: S1/S2 normal, no murmur, no rub/gallop auscultated. RRR.   Musculoskeletal: Gait normal. Symmetric and independent movement of all extremities  Neurological: Normal balance/coordination. No tremor.  Skin: warm, dry, intact. No severe ecchymoses  Psychiatric: Normal judgment/insight. Normal mood and affect. Oriented x3.      ASSESSMENT/PLAN:   Eustachian tube dysfunction, right - See patient instructions, consider ENT referral but will trial other OTC/prescription treatments first  Essential hypertension - Continue current regimen, follow-up for annual physical when due  Stage 2 moderate COPD by GOLD classification (HCC) - Smoking cessation advised  Tobacco dependence - Doing well  cutting back  Easy bruisability - Taking low-dose aspirin. Patient declines further blood work at this time    Patient Instructions  Plan: Blocked  eustachian tube causing ear pressure: nasal spray (prescription Atrovent +/- nasal saline washes OTC) plus antihistamine (double-dose Claritin) for one week. If no better at that time, or sooner if worse, fill the antibiotics and if that's not helping please call the office for further instructions        Follow-up plan: Return for ANNUAL PHYSICAL when due .  Visit summary with medication list and pertinent instructions was printed for patient to review, alert us if any changes needed. All questions at time of visit were answered - patient instructed to contact office with any additional concerns. ER/RTC precautions were reviewed with the patient and understanding verbalized.

## 2016-05-11 NOTE — Patient Instructions (Addendum)
Plan: Blocked eustachian tube causing ear pressure: nasal spray (prescription Atrovent +/- nasal saline washes OTC) plus antihistamine (double-dose Claritin) for one week. If no better at that time, or sooner if worse, fill the antibiotics and if that's not helping please call the office for further instructions

## 2016-07-29 ENCOUNTER — Encounter: Payer: Self-pay | Admitting: Osteopathic Medicine

## 2016-07-29 ENCOUNTER — Ambulatory Visit (INDEPENDENT_AMBULATORY_CARE_PROVIDER_SITE_OTHER): Payer: Self-pay | Admitting: Osteopathic Medicine

## 2016-07-29 ENCOUNTER — Ambulatory Visit (INDEPENDENT_AMBULATORY_CARE_PROVIDER_SITE_OTHER): Payer: Self-pay

## 2016-07-29 ENCOUNTER — Other Ambulatory Visit: Payer: Self-pay | Admitting: Osteopathic Medicine

## 2016-07-29 VITALS — BP 131/81 | HR 74 | Ht 72.0 in | Wt 260.0 lb

## 2016-07-29 DIAGNOSIS — R509 Fever, unspecified: Secondary | ICD-10-CM | POA: Insufficient documentation

## 2016-07-29 DIAGNOSIS — R5383 Other fatigue: Secondary | ICD-10-CM

## 2016-07-29 DIAGNOSIS — R748 Abnormal levels of other serum enzymes: Secondary | ICD-10-CM

## 2016-07-29 DIAGNOSIS — F17200 Nicotine dependence, unspecified, uncomplicated: Secondary | ICD-10-CM

## 2016-07-29 DIAGNOSIS — M791 Myalgia, unspecified site: Secondary | ICD-10-CM | POA: Insufficient documentation

## 2016-07-29 DIAGNOSIS — I10 Essential (primary) hypertension: Secondary | ICD-10-CM

## 2016-07-29 LAB — CBC WITH DIFFERENTIAL/PLATELET
BASOS ABS: 0 {cells}/uL (ref 0–200)
Basophils Relative: 0 %
Eosinophils Absolute: 91 cells/uL (ref 15–500)
Eosinophils Relative: 1 %
HEMATOCRIT: 47.8 % (ref 38.5–50.0)
Hemoglobin: 16 g/dL (ref 13.2–17.1)
LYMPHS ABS: 4186 {cells}/uL — AB (ref 850–3900)
Lymphocytes Relative: 46 %
MCH: 31.4 pg (ref 27.0–33.0)
MCHC: 33.5 g/dL (ref 32.0–36.0)
MCV: 93.7 fL (ref 80.0–100.0)
MONO ABS: 910 {cells}/uL (ref 200–950)
MPV: 10.7 fL (ref 7.5–12.5)
Monocytes Relative: 10 %
NEUTROS PCT: 43 %
Neutro Abs: 3913 cells/uL (ref 1500–7800)
Platelets: 290 10*3/uL (ref 140–400)
RBC: 5.1 MIL/uL (ref 4.20–5.80)
RDW: 13.6 % (ref 11.0–15.0)
WBC: 9.1 10*3/uL (ref 3.8–10.8)

## 2016-07-29 MED ORDER — LISINOPRIL 10 MG PO TABS: ORAL_TABLET | ORAL | 1 refills | Status: DC

## 2016-07-29 NOTE — Patient Instructions (Signed)
Plan:  When there is uncertain cause of fatigue/lamaise and migrating pain w/ fever, we are thinking of a few possibilities including, but not limited to  infection (vial infection, lyme disease, others)  inflammatory disease (rheumatologic or autoimmune condition)  cancer (especially in smokers - lung cancer, also thinking of colon cancer, prostate cancer in men)  or other systemic cause (thyroid problem)  Basically, when symptoms aren't pointing us to anything specific, we cast a fairly wide net with our workup. Let's plan on getting further testing or initiating our best guess treatment if the labs aren't showing anything and you're not improving.   If you get worse or anything changes, let us know!  Especially if chest pain, trouble breathing, headaches, rash, or other concerns  Monitor your temperature at home and times that it is high

## 2016-07-29 NOTE — Progress Notes (Signed)
HPI: Eric Baird is a 56 y.o. male  who presents to [facility] today, 07/29/16,  for chief complaint of:  Chief Complaint  Patient presents with  . Fatigue    Fatigue  . Context: No recent travel, no tick bite, no injury, no recent illness. . Location & quality: Generalized fatigue/malaise . Severity & duration: Has been about the same over the course of 2 weeks . Assoc signs/symptoms: Subjective fevers on and off, no significant night sweats. Migrating joint pain and right-sided rib pain in the area where he previously fractured a few ribs.   Past medical, surgical, social and family history reviewed: Patient Active Problem List   Diagnosis Date Noted  . Stage 2 moderate COPD by GOLD classification (HCC) 01/14/2016  . Essential hypertension 11/03/2015  . Tobacco dependence 11/03/2015   Past Surgical History:  Procedure Laterality Date  . KNEE SURGERY Right    Social History  Substance Use Topics  . Smoking status: Current Every Day Smoker    Packs/day: 1.00    Types: Cigarettes  . Smokeless tobacco: Never Used  . Alcohol use No   Family History  Problem Relation Age of Onset  . Family history unknown: Yes     Current medication list and allergy/intolerance information reviewed:   Current Outpatient Prescriptions  Medication Sig Dispense Refill  . albuterol (PROVENTIL HFA;VENTOLIN HFA) 108 (90 BASE) MCG/ACT inhaler Inhale into the lungs every 6 (six) hours as needed for wheezing or shortness of breath.    [name]. ipratropium (ATROVENT) 0.06 % nasal spray Place 2 sprays into both nostrils 4 (four) times daily. 15 mL 0  . lisinopril (PRINIVIL,ZESTRIL) 10 MG tablet TAKE 1 TABLET(10 MG) BY MOUTH DAILY 90 tablet 1  . loratadine (CLARITIN) 10 MG tablet Take 1 tablet (10 mg total) by mouth 2 (two) times daily. For one week 15 tablet 0   No current facility-administered medications for this visit.    No Known Allergies    Review of  Systems:  Constitutional:  +subjective fever, no chills, No recent illness, No unintentional weight changes. +significant fatigue.   HEENT: No  headache, no vision change, no hearing change, No sore throat, No  sinus pressure  Cardiac: No  chest pain, No  pressure, No palpitations, No  Orthopnea  Respiratory:  No  shortness of breath. No  Cough  Gastrointestinal: No  abdominal pain, No  nausea, No  vomiting,  No  blood in stool, No  diarrhea, No  constipation   Musculoskeletal: +new migrating myalgia/arthralgia  Skin: No  Rash, No other wounds/concerning lesions  Hem/Onc: No  easy bruising/bleeding, No  abnormal lymph node  Endocrine: No cold intolerance,  No heat intolerance.  Neurologic: No  weakness, No  dizziness  Psychiatric: No  concerns with depression, No  concerns with anxiety, + sleep problems - sleeping a lot lately, No mood problems  Exam:  BP 131/81   Pulse 74   Ht 6' (1.829 m)   Wt 260 lb (117.9 kg)   BMI 35.26 kg/m   Constitutional: VS see above. General Appearance: alert, well-developed, well-nourished, NAD  Eyes: Normal lids and conjunctive, non-icteric sclera  Ears, Nose, Mouth, Throat: MMM, Normal external inspection ears/nares/mouth/lips/gums. TM normal bilaterally. Pharynx/tonsils no erythema, no exudate. Nasal mucosa normal.   Neck: No masses, trachea midline. No thyroid enlargement. No tenderness/mass appreciated. No lymphadenopathy  Respiratory: Normal respiratory effort. no wheeze, no rhonchi, no rales  Cardiovascular: S1/S2 normal, no murmur, no rub/gallop auscultated. RRR.  No lower extremity edema.   Gastrointestinal: Nontender, no masses. No hepatomegaly, no splenomegaly. No hernia appreciated. Bowel sounds normal. Rectal exam deferred.   Musculoskeletal: Gait normal. No clubbing/cyanosis of digits.   Neurological: Normal balance/coordination. No tremor. No cranial nerve deficit on limited exam.  Skin: warm, dry, intact. No rash/ulcer.    Psychiatric: Normal judgment/insight. Normal mood and affect. Oriented x3.    Dg Chest 2 View  Result Date: 07/29/2016 CLINICAL DATA:  Fever and fatigue EXAM: CHEST  2 VIEW COMPARISON:  10/15/2013 FINDINGS: Cardiac shadow is within normal limits. The lungs are well aerated bilaterally. Bilateral nipple shadows are seen. No focal infiltrate or sizable effusion is noted. Old rib fractures are seen on the right. No acute bony abnormality is seen. IMPRESSION: No active cardiopulmonary disease. Electronically Signed   By: Alcide CleverMark  Lukens M.D.   On: 07/29/2016 16:04     ASSESSMENT/PLAN: Nonspecific complaints of fatigue but concerning symptoms of migrating myalgia and intermittent fever. No red flags to concern for tick borne illness such as rash/headache, no red flags for lung cancer in a smoker such as coughing/hemoptysis, no red flags for GI disease such as abdominal pain or blood in stool. Encouraged patient to be checking history pressure at home. Lack of insurance as a barrier to full workup at this time, will get basic labs and chest x-ray, see where this leads us. Would consider empiric treatment for possible tickborne illness, may consider proceed with rheumatologic workup if inflammatory markers are significant.  Fatigue, unspecified type - Plan: CBC with Differential/Platelet, COMPLETE METABOLIC PANEL WITH GFR, TSH, PSA, DG Chest 2 View  Essential hypertension - Plan: lisinopril (PRINIVIL,ZESTRIL) 10 MG tablet, CBC with Differential/Platelet, COMPLETE METABOLIC PANEL WITH GFR  Fever chills - Plan: CBC with Differential/Platelet, COMPLETE METABOLIC PANEL WITH GFR, TSH, PSA, DG Chest 2 View  Generalized muscle ache - Plan: CBC with Differential/Platelet, COMPLETE METABOLIC PANEL WITH GFR, TSH, High sensitivity CRP, CK, Sedimentation rate    Patient Instructions  Plan:  When there is uncertain cause of fatigue/lamaise and migrating pain w/ fever, we are thinking of a few possibilities  including, but not limited to  infection (vial infection, lyme disease, others)  inflammatory disease (rheumatologic or autoimmune condition)  cancer (especially in smokers - lung cancer, also thinking of colon cancer, prostate cancer in men)  or other systemic cause (thyroid problem)  Basically, when symptoms aren't pointing us to anything specific, we cast a fairly wide net with our workup. Let's plan on getting further testing or initiating our best guess treatment if the labs aren't showing anything and you're not improving.   If you get worse or anything changes, let us know!  Especially if chest pain, trouble breathing, headaches, rash, or other concerns  Monitor your temperature at home and times that it is high     Visit summary with medication list and pertinent instructions was printed for patient to review. All questions at time of visit were answered - patient instructed to contact office with any additional concerns. ER/RTC precautions were reviewed with the patient. Follow-up plan: Return if symptoms worsen or fail to improve, and depending on lans and Xray .

## 2016-07-30 LAB — COMPLETE METABOLIC PANEL WITH GFR
ALBUMIN: 4.1 g/dL (ref 3.6–5.1)
ALK PHOS: 61 U/L (ref 40–115)
ALT: 21 U/L (ref 9–46)
AST: 24 U/L (ref 10–35)
BUN: 11 mg/dL (ref 7–25)
CALCIUM: 9.4 mg/dL (ref 8.6–10.3)
CO2: 20 mmol/L (ref 20–31)
Chloride: 104 mmol/L (ref 98–110)
Creat: 1.1 mg/dL (ref 0.70–1.33)
GFR, Est African American: 85 mL/min (ref >=60)
GFR, Est Non African American: 74 mL/min (ref >=60)
GLUCOSE: 76 mg/dL (ref 65–99)
POTASSIUM: 4.2 mmol/L (ref 3.5–5.3)
Sodium: 136 mmol/L (ref 135–146)
Total Bilirubin: 0.3 mg/dL (ref 0.2–1.2)
Total Protein: 6.7 g/dL (ref 6.1–8.1)

## 2016-07-30 LAB — SEDIMENTATION RATE: Sed Rate: 1 mm/hr (ref 0–20)

## 2016-07-30 LAB — TSH: TSH: 1.01 mIU/L (ref 0.40–4.50)

## 2016-07-30 LAB — CK: CK TOTAL: 1103 U/L — AB (ref 44–196)

## 2016-07-31 LAB — PSA: PSA: 0.2 ng/mL (ref <=4.0)

## 2016-08-02 LAB — HIGH SENSITIVITY CRP: CRP, High Sensitivity: 1.1 mg/L

## 2016-08-06 NOTE — Addendum Note (Signed)
Addended by: Deirdre PippinsALEXANDER, Kaven Cumbie M on: 08/06/2016 01:39 PM   Modules accepted: Orders

## 2016-08-10 LAB — CK: Total CK: 82 U/L (ref 44–196)

## 2016-11-22 ENCOUNTER — Ambulatory Visit (INDEPENDENT_AMBULATORY_CARE_PROVIDER_SITE_OTHER): Payer: Self-pay | Admitting: Osteopathic Medicine

## 2016-11-22 VITALS — BP 123/73 | HR 84

## 2016-11-22 DIAGNOSIS — I10 Essential (primary) hypertension: Secondary | ICD-10-CM

## 2016-11-22 DIAGNOSIS — Z23 Encounter for immunization: Secondary | ICD-10-CM

## 2016-11-22 MED ORDER — OLMESARTAN MEDOXOMIL 5 MG PO TABS: 5.0000 mg | ORAL_TABLET | Freq: Every day | ORAL | 1 refills | Status: DC

## 2016-11-22 NOTE — Progress Notes (Signed)
BP 123/73   Pulse 84  He has been on lisinopril since July, if he is concerned that this might be causing a dry cough, I can switch the medication, patient is also a smoker. We'll go ahead and switch and he will need to come see me in another 2 weeks to see how he is doing on the new medicine, visit with me not nurse visit

## 2016-11-22 NOTE — Addendum Note (Signed)
Addended by: Deirdre PippinsALEXANDER, Jordon Bourquin M on: 11/22/2016 05:56 PM   Modules accepted: Orders, Level of Service

## 2016-11-22 NOTE — Progress Notes (Signed)
Pt came into clinic today for flu shot. While here, he advised the lisinopril has started causing a dry cough. Will route to PCP for review. He is due for follow up in January.

## 2016-11-24 NOTE — Progress Notes (Signed)
Pt advised of PCP recommendation, he does not want to change lisinopril Rx at this time. No further questions.

## 2016-12-17 ENCOUNTER — Encounter: Payer: Self-pay | Admitting: Osteopathic Medicine

## 2016-12-17 ENCOUNTER — Ambulatory Visit (INDEPENDENT_AMBULATORY_CARE_PROVIDER_SITE_OTHER): Payer: Self-pay | Admitting: Osteopathic Medicine

## 2016-12-17 VITALS — BP 111/71 | HR 79 | Temp 98.2°F | Wt 267.0 lb

## 2016-12-17 DIAGNOSIS — F17200 Nicotine dependence, unspecified, uncomplicated: Secondary | ICD-10-CM

## 2016-12-17 DIAGNOSIS — J40 Bronchitis, not specified as acute or chronic: Secondary | ICD-10-CM

## 2016-12-17 MED ORDER — ALBUTEROL SULFATE HFA 108 (90 BASE) MCG/ACT IN AERS: 1.0000 | INHALATION_SPRAY | Freq: Four times a day (QID) | RESPIRATORY_TRACT | 6 refills | Status: DC | PRN

## 2016-12-17 MED ORDER — AZITHROMYCIN 250 MG PO TABS: ORAL_TABLET | ORAL | 0 refills | Status: DC

## 2016-12-17 MED ORDER — GUAIFENESIN-CODEINE 100-10 MG/5ML PO SOLN: 5.0000 mL | Freq: Four times a day (QID) | ORAL | 0 refills | Status: DC | PRN

## 2016-12-17 MED ORDER — BUPROPION HCL ER (XL) 300 MG PO TB24: 300.0000 mg | ORAL_TABLET | Freq: Every day | ORAL | 1 refills | Status: DC

## 2016-12-17 MED ORDER — VARENICLINE TARTRATE 0.5 MG X 11 & 1 MG X 42 PO MISC: ORAL | 0 refills | Status: DC

## 2016-12-17 MED ORDER — VARENICLINE TARTRATE 1 MG PO TABS: 1.0000 mg | ORAL_TABLET | Freq: Two times a day (BID) | ORAL | 1 refills | Status: DC

## 2016-12-17 NOTE — Progress Notes (Signed)
HPI: Eric BelfastRichard Baird is a 56 y.o. male who  has no past medical history on file.  he presents to [facility] today, 12/17/16,  for chief complaint of:  Chief Complaint  Patient presents with  . Cough  . Fever    Coughing, congestion, fever on and off x1 week. Fever at home up to 101. Normal temp now. DayQuil and NyQuil tried and somewhat helpful.   Still smoking: would like to refill wellbutrin and see if he can get Chantix covered to switch to this   HTN: hasn't picked up benicar yet, we switched from lisinopril d/t pt concern for coughing.      Past medical, surgical, social and family history reviewed:  Patient Active Problem List   Diagnosis Date Noted  . Fever chills 07/29/2016  . Fatigue 07/29/2016  . Generalized muscle ache 07/29/2016  . Stage 2 moderate COPD by GOLD classification (HCC) 01/14/2016  . Essential hypertension 11/03/2015  . Tobacco dependence 11/03/2015    Past Surgical History:  Procedure Laterality Date  . KNEE SURGERY Right     Social History   Tobacco Use  . Smoking status: Current Every Day Smoker    Packs/day: 1.00    Types: Cigarettes  . Smokeless tobacco: Never Used  Substance Use Topics  . Alcohol use: No    Family History  Family history unknown: Yes     Current medication list and allergy/intolerance information reviewed:    Current Outpatient Medications  Medication Sig Dispense Refill  . albuterol (PROVENTIL HFA;VENTOLIN HFA) 108 (90 BASE) MCG/ACT inhaler Inhale into the lungs every 6 (six) hours as needed for wheezing or shortness of breath.    [name]. ipratropium (ATROVENT) 0.06 % nasal spray Place 2 sprays into both nostrils 4 (four) times daily. 15 mL 0  . loratadine (CLARITIN) 10 MG tablet Take 1 tablet (10 mg total) by mouth 2 (two) times daily. For one week 15 tablet 0  . olmesartan (BENICAR) 5 MG tablet Take 1 tablet (5 mg total) daily by mouth. 30 tablet 1   No current  facility-administered medications for this visit.     No Known Allergies    Review of Systems:  Constitutional:  +fever, no chills, +recent illness, No unintentional weight changes.  HEENT: No  headache, no vision change, no hearing change, +sore throat, +sinus pressure  Cardiac: No  chest pain, No  pressure, No palpitations, No  Orthopnea  Respiratory:  No  shortness of breath. +Cough  Gastrointestinal: No  abdominal pain, No  nausea, No  vomiting,    Neurologic: No  weakness, No  dizziness,    Exam:  BP 111/71   Pulse 79   Temp 98.2 F (36.8 C) (Oral)   Wt 267 lb (121.1 kg)   BMI 36.21 kg/m    Constitutional: VS see above. General Appearance: alert, well-developed, well-nourished, NAD  Eyes: Normal lids and conjunctive, non-icteric sclera  Ears, Nose, Mouth, Throat: MMM, Normal external inspection ears/nares/mouth/lips/gums. TM normal bilaterally. Pharynx/tonsils no erythema, no exudate. Nasal mucosa normal.   Neck: No masses, trachea midline. No thyroid enlargement. No tenderness/mass appreciated. No lymphadenopathy  Respiratory: Normal respiratory effort. +bilateral coarse breath sounds and faint wheeze, no rhonchi, no rales  Cardiovascular: S1/S2 normal, no murmur, no rub/gallop auscultated. RRR. No lower extremity edema.   GNeurological: Normal balance/coordination. No tremor.     Psychiatric: Normal judgment/insight. Normal mood and affect. Oriented x3.    ASSESSMENT/PLAN:   Bronchitis - pt would like to  defer CXR unless absolutely needed d/t cost - sounds like bronchitis, wlil cover for CAP. Consider COPD dx, no PFT, no chronic sob. If gets worse over the weekend would consider send Levaquin plus steroids. Pt to call if worse but appears okay for the moment   Tobacco dependence - Plan: buPROPion (WELLBUTRIN XL) 300 MG 24 hr tablet     Meds ordered this encounter  Medications  . albuterol (PROVENTIL HFA;VENTOLIN HFA) 108 (90 Base) MCG/ACT inhaler     Sig: Inhale 1-2 puffs into the lungs every 6 (six) hours as needed for wheezing or shortness of breath.    Dispense:  1 Inhaler    Refill:  6  . azithromycin (ZITHROMAX) 250 MG tablet    Sig: 2 tabs po on Day 1, then 1 tab daily Days 2 - 5    Dispense:  6 tablet    Refill:  0  . guaiFENesin-codeine 100-10 MG/5ML syrup    Sig: Take 5-10 mLs by mouth every 6 (six) hours as needed for cough.    Dispense:  180 mL    Refill:  0  . buPROPion (WELLBUTRIN XL) 300 MG 24 hr tablet    Sig: Take 1 tablet (300 mg total) by mouth daily.    Dispense:  30 tablet    Refill:  1  . varenicline (CHANTIX STARTING MONTH PAK) 0.5 MG X 11 & 1 MG X 42 tablet    Sig: Take one 0.5 mg tablet by mouth once daily for 3 days, then increase to one 0.5 mg tablet twice daily for 4 days, then increase to one 1 mg tablet twice daily.    Dispense:  53 tablet    Refill:  0  . varenicline (CHANTIX CONTINUING MONTH PAK) 1 MG tablet    Sig: Take 1 tablet (1 mg total) by mouth 2 (two) times daily.    Dispense:  30 tablet    Refill:  1       Patient Instructions  Plan: Antibiotics and cough medicine, inhaler refilled Quit smoking! I sent Wellbutrin for now and printed Chantix    Acute Bronchitis, Adult Acute bronchitis is sudden (acute) swelling of the air tubes (bronchi) in the lungs. Acute bronchitis causes these tubes to fill with mucus, which can make it hard to breathe. It can also cause coughing or wheezing. In adults, acute bronchitis usually goes away within 2 weeks. A cough caused by bronchitis may last up to 3 weeks. Smoking, allergies, and asthma can make the condition worse. Repeated episodes of bronchitis may cause further lung problems, such as chronic obstructive pulmonary disease (COPD). What are the causes? This condition can be caused by germs and by substances that irritate the lungs, including:  Cold and flu viruses. This condition is most often caused by the same virus that causes a  cold.  Bacteria.  Exposure to tobacco smoke, dust, fumes, and air pollution.  What increases the risk? This condition is more likely to develop in people who:  Have close contact with someone with acute bronchitis.  Are exposed to lung irritants, such as tobacco smoke, dust, fumes, and vapors.  Have a weak immune system.  Have a respiratory condition such as asthma.  What are the signs or symptoms? Symptoms of this condition include:  A cough.  Coughing up clear, yellow, or green mucus.  Wheezing.  Chest congestion.  Shortness of breath.  A fever.  Body aches.  Chills.  A sore throat.  How is this diagnosed?  This condition is usually diagnosed with a physical exam. During the exam, your health care provider may order tests, such as chest X-rays, to rule out other conditions. He or she may also:  Test a sample of your mucus for bacterial infection.  Check the level of oxygen in your blood. This is done to check for pneumonia.  Do a chest X-ray or lung function testing to rule out pneumonia and other conditions.  Perform blood tests.  Your health care provider will also ask about your symptoms and medical history. How is this treated? Most cases of acute bronchitis clear up over time without treatment. Your health care provider may recommend:  Drinking more fluids. Drinking more makes your mucus thinner, which may make it easier to breathe.  Taking a medicine for a fever or cough.  Taking an antibiotic medicine.  Using an inhaler to help improve shortness of breath and to control a cough.  Using a cool mist vaporizer or humidifier to make it easier to breathe.  Follow these instructions at home: Medicines  Take over-the-counter and prescription medicines only as told by your health care provider.  If you were prescribed an antibiotic, take it as told by your health care provider. Do not stop taking the antibiotic even if you start to feel  better. General instructions  Get plenty of rest.  Drink enough fluids to keep your urine clear or pale yellow.  Avoid smoking and secondhand smoke. Exposure to cigarette smoke or irritating chemicals will make bronchitis worse. If you smoke and you need help quitting, ask your health care provider. Quitting smoking will help your lungs heal faster.  Use an inhaler, cool mist vaporizer, or humidifier as told by your health care provider.  Keep all follow-up visits as told by your health care provider. This is important. How is this prevented? To lower your risk of getting this condition again:  Wash your hands often with soap and water. If soap and water are not available, use hand sanitizer.  Avoid contact with people who have cold symptoms.  Try not to touch your hands to your mouth, nose, or eyes.  Make sure to get the flu shot every year.  Contact a health care provider if:  Your symptoms do not improve in 2 weeks of treatment. Get help right away if:  You cough up blood.  You have chest pain.  You have severe shortness of breath.  You become dehydrated.  You faint or keep feeling like you are going to faint.  You keep vomiting.  You have a severe headache.  Your fever or chills gets worse. This information is not intended to replace advice given to you by your health care provider. Make sure you discuss any questions you have with your health care provider. Document Released: 01/29/2004 Document Revised: 07/16/2015 Document Reviewed: 06/11/2015 Elsevier Interactive Patient Education  2017 [employer]     Visit summary with medication list and pertinent instructions was printed for patient to review. All questions at time of visit were answered - patient instructed to contact office with any additional concerns. ER/RTC precautions were reviewed with the patient.   Follow-up plan: Return if symptoms worsen or fail to improve.   Please note: voice  recognition software was used to produce this document, and typos may escape review. Please contact Dr. Lyn Hollingshead for any needed clarifications.

## 2016-12-17 NOTE — Patient Instructions (Addendum)
Plan: Antibiotics and cough medicine, inhaler refilled Quit smoking! I sent Wellbutrin for now and printed Chantix    Acute Bronchitis, Adult Acute bronchitis is sudden (acute) swelling of the air tubes (bronchi) in the lungs. Acute bronchitis causes these tubes to fill with mucus, which can make it hard to breathe. It can also cause coughing or wheezing. In adults, acute bronchitis usually goes away within 2 weeks. A cough caused by bronchitis may last up to 3 weeks. Smoking, allergies, and asthma can make the condition worse. Repeated episodes of bronchitis may cause further lung problems, such as chronic obstructive pulmonary disease (COPD). What are the causes? This condition can be caused by germs and by substances that irritate the lungs, including:  Cold and flu viruses. This condition is most often caused by the same virus that causes a cold.  Bacteria.  Exposure to tobacco smoke, dust, fumes, and air pollution.  What increases the risk? This condition is more likely to develop in people who:  Have close contact with someone with acute bronchitis.  Are exposed to lung irritants, such as tobacco smoke, dust, fumes, and vapors.  Have a weak immune system.  Have a respiratory condition such as asthma.  What are the signs or symptoms? Symptoms of this condition include:  A cough.  Coughing up clear, yellow, or green mucus.  Wheezing.  Chest congestion.  Shortness of breath.  A fever.  Body aches.  Chills.  A sore throat.  How is this diagnosed? This condition is usually diagnosed with a physical exam. During the exam, your health care provider may order tests, such as chest X-rays, to rule out other conditions. He or she may also:  Test a sample of your mucus for bacterial infection.  Check the level of oxygen in your blood. This is done to check for pneumonia.  Do a chest X-ray or lung function testing to rule out pneumonia and other conditions.  Perform  blood tests.  Your health care provider will also ask about your symptoms and medical history. How is this treated? Most cases of acute bronchitis clear up over time without treatment. Your health care provider may recommend:  Drinking more fluids. Drinking more makes your mucus thinner, which may make it easier to breathe.  Taking a medicine for a fever or cough.  Taking an antibiotic medicine.  Using an inhaler to help improve shortness of breath and to control a cough.  Using a cool mist vaporizer or humidifier to make it easier to breathe.  Follow these instructions at home: Medicines  Take over-the-counter and prescription medicines only as told by your health care provider.  If you were prescribed an antibiotic, take it as told by your health care provider. Do not stop taking the antibiotic even if you start to feel better. General instructions  Get plenty of rest.  Drink enough fluids to keep your urine clear or pale yellow.  Avoid smoking and secondhand smoke. Exposure to cigarette smoke or irritating chemicals will make bronchitis worse. If you smoke and you need help quitting, ask your health care provider. Quitting smoking will help your lungs heal faster.  Use an inhaler, cool mist vaporizer, or humidifier as told by your health care provider.  Keep all follow-up visits as told by your health care provider. This is important. How is this prevented? To lower your risk of getting this condition again:  Wash your hands often with soap and water. If soap and water are not available, use  hand sanitizer.  Avoid contact with people who have cold symptoms.  Try not to touch your hands to your mouth, nose, or eyes.  Make sure to get the flu shot every year.  Contact a health care provider if:  Your symptoms do not improve in 2 weeks of treatment. Get help right away if:  You cough up blood.  You have chest pain.  You have severe shortness of breath.  You  become dehydrated.  You faint or keep feeling like you are going to faint.  You keep vomiting.  You have a severe headache.  Your fever or chills gets worse. This information is not intended to replace advice given to you by your health care provider. Make sure you discuss any questions you have with your health care provider. Document Released: 01/29/2004 Document Revised: 07/16/2015 Document Reviewed: 06/11/2015 Elsevier Interactive Patient Education  2017 [employer]

## 2017-01-17 ENCOUNTER — Telehealth: Payer: Self-pay

## 2017-01-17 NOTE — Telephone Encounter (Signed)
Pt called stating he cannot afford benicar rx ($140) and would like to switch back to lisinopril. Requesting for med to be sent to pharmacy on file. Pls advise, thanks.

## 2017-01-18 MED ORDER — LISINOPRIL 10 MG PO TABS
10.0000 mg | ORAL_TABLET | Freq: Every day | ORAL | 3 refills | Status: DC
Start: 2017-01-18 — End: 2018-01-12

## 2017-01-18 NOTE — Telephone Encounter (Signed)
Prescription sent to [facility].

## 2017-01-18 NOTE — Telephone Encounter (Signed)
Pt has been notified that lisinopril medication has been sent to local pharmacy.

## 2017-02-08 ENCOUNTER — Ambulatory Visit (INDEPENDENT_AMBULATORY_CARE_PROVIDER_SITE_OTHER): Payer: Self-pay

## 2017-02-08 ENCOUNTER — Encounter: Payer: Self-pay | Admitting: Osteopathic Medicine

## 2017-02-08 ENCOUNTER — Ambulatory Visit (INDEPENDENT_AMBULATORY_CARE_PROVIDER_SITE_OTHER): Payer: Self-pay | Admitting: Osteopathic Medicine

## 2017-02-08 VITALS — BP 114/84 | HR 97 | Temp 98.2°F | Wt 266.1 lb

## 2017-02-08 DIAGNOSIS — J40 Bronchitis, not specified as acute or chronic: Secondary | ICD-10-CM

## 2017-02-08 DIAGNOSIS — I10 Essential (primary) hypertension: Secondary | ICD-10-CM

## 2017-02-08 DIAGNOSIS — J449 Chronic obstructive pulmonary disease, unspecified: Secondary | ICD-10-CM

## 2017-02-08 DIAGNOSIS — R0602 Shortness of breath: Secondary | ICD-10-CM

## 2017-02-08 DIAGNOSIS — J441 Chronic obstructive pulmonary disease with (acute) exacerbation: Secondary | ICD-10-CM

## 2017-02-08 DIAGNOSIS — R0989 Other specified symptoms and signs involving the circulatory and respiratory systems: Secondary | ICD-10-CM

## 2017-02-08 MED ORDER — PREDNISONE 20 MG PO TABS: 20.0000 mg | ORAL_TABLET | Freq: Two times a day (BID) | ORAL | 0 refills | Status: DC

## 2017-02-08 MED ORDER — AZITHROMYCIN 250 MG PO TABS: ORAL_TABLET | ORAL | 0 refills | Status: DC

## 2017-02-08 MED ORDER — GUAIFENESIN-CODEINE 100-10 MG/5ML PO SOLN: 5.0000 mL | Freq: Four times a day (QID) | ORAL | 0 refills | Status: DC | PRN

## 2017-02-08 MED ORDER — BUDESONIDE-FORMOTEROL FUMARATE 160-4.5 MCG/ACT IN AERO: 2.0000 | INHALATION_SPRAY | Freq: Two times a day (BID) | RESPIRATORY_TRACT | 3 refills | Status: DC

## 2017-02-08 NOTE — Progress Notes (Signed)
HPI: Eric Baird is a 57 y.o. male who  has no past medical history on file.  he presents to [facility] today, 02/08/17,  for chief complaint of: Cough, illness  Coughing and chills x5 days, blood in the R ear 5 days ago. SOB and passing out from coughing fits. Productive cough. He waited until appointment today - didn't want to see another provider in the office.   BP elevated on intake, took meds this AM, repeat looked better.   Still smoking. Unable to afford Chantix.     Past medical, surgical, social and family history reviewed:  Patient Active Problem List   Diagnosis Date Noted  . Fever chills 07/29/2016  . Fatigue 07/29/2016  . Generalized muscle ache 07/29/2016  . Stage 2 moderate COPD by GOLD classification (HCC) 01/14/2016  . Essential hypertension 11/03/2015  . Tobacco dependence 11/03/2015    Past Surgical History:  Procedure Laterality Date  . KNEE SURGERY Right     Social History   Tobacco Use  . Smoking status: Current Every Day Smoker    Packs/day: 1.00    Types: Cigarettes  . Smokeless tobacco: Never Used  Substance Use Topics  . Alcohol use: No    Family History  Family history unknown: Yes     Current medication list and allergy/intolerance information reviewed:    Current Outpatient Medications  Medication Sig Dispense Refill  . albuterol (PROVENTIL HFA;VENTOLIN HFA) 108 (90 Base) MCG/ACT inhaler Inhale 1-2 puffs into the lungs every 6 (six) hours as needed for wheezing or shortness of breath. 1 Inhaler 6  . azithromycin (ZITHROMAX) 250 MG tablet 2 tabs po on Day 1, then 1 tab daily Days 2 - 5 6 tablet 0  . buPROPion (WELLBUTRIN XL) 300 MG 24 hr tablet Take 1 tablet (300 mg total) by mouth daily. 30 tablet 1  . guaiFENesin-codeine 100-10 MG/5ML syrup Take 5-10 mLs by mouth every 6 (six) hours as needed for cough. 180 mL 0  . ipratropium (ATROVENT) 0.06 % nasal spray Place 2 sprays into both nostrils 4  (four) times daily. 15 mL 0  . lisinopril (PRINIVIL,ZESTRIL) 10 MG tablet Take 1 tablet (10 mg total) by mouth daily. 90 tablet 3  . loratadine (CLARITIN) 10 MG tablet Take 1 tablet (10 mg total) by mouth 2 (two) times daily. For one week 15 tablet 0   No current facility-administered medications for this visit.     No Known Allergies    Review of Systems:  Constitutional:  +Subjective fever, +chills, +recent illness, No unintentional weight changes. No significant fatigue.   HEENT: No  headache, no vision change, no hearing change, +sore throat, +sinus pressure  Cardiac: No  chest pain, No  pressure, No palpitations, No  Orthopnea  Respiratory:  +shortness of breath. +Cough  Gastrointestinal: No  abdominal pain, No  nausea  Musculoskeletal: No new myalgia/arthralgia  Skin: No  Rash  Hem/Onc: No  easy bruising/bleeding  Neurologic: No  weakness, +dizziness   Exam:  BP 114/84 (BP Location: Left Arm)   Pulse 97   Temp 98.2 F (36.8 C) (Oral)   Wt 266 lb 1.9 oz (120.7 kg)   SpO2 95%   BMI 36.09 kg/m   Constitutional: VS see above. General Appearance: alert, well-developed, well-nourished, NAD  Eyes: Normal lids and conjunctive, non-icteric sclera  Ears, Nose, Mouth, Throat: MMM, Normal external inspection ears/nares/mouth/lips/gums. TM normal bilaterally. Pharynx/tonsils no erythema, no exudate. Nasal mucosa normal.   Neck: No masses,  trachea midline. No thyroid enlargement. No tenderness/mass appreciated. No lymphadenopathy  Respiratory: Normal respiratory effort, +bilateral diffuse coarse breath sounds and wheeze, no rhonchi, no rales  Cardiovascular: S1/S2 normal, no murmur, no rub/gallop auscultated. RRR. No lower extremity edema.  Musculoskeletal: Gait normal  Neurological: Normal balance/coordination. No tremor.  Skin: warm, dry, intact.   Psychiatric: Normal judgment/insight. Normal mood and affect. Oriented x3.   CXR on personal  review Cardiomediastinal silhouette/heart size: normal Obvious bony abnormality: none Infiltrate: ? L lower lobe Mass or other opacity: ? L lower lobe Atelectasis: ? L lower lobe Diaphragms: normal Lateral view: normal Images were reviewed with the patient. I don't see obvious PNA but questionable LLL infiltrate vs breast tissue vs atelectasis?  Pt counseled that radiologist will review the images as well, our office will call if the formal read reveals any significant findings other than what has been noted above.     ASSESSMENT/PLAN:   COPD with acute exacerbation (HCC) - Plan: predniSONE (DELTASONE) 20 MG tablet, budesonide-formoterol (SYMBICORT) 160-4.5 MCG/ACT inhaler, guaiFENesin-codeine 100-10 MG/5ML syrup, DG Chest 2 View  Stage 2 moderate COPD by GOLD classification (HCC) - Plan: budesonide-formoterol (SYMBICORT) 160-4.5 MCG/ACT inhaler  Bronchitis - Plan: predniSONE (DELTASONE) 20 MG tablet, guaiFENesin-codeine 100-10 MG/5ML syrup  Essential hypertension      Visit summary with medication list and pertinent instructions was printed for patient to review. All questions at time of visit were answered - patient instructed to contact office with any additional concerns. ER/RTC precautions were reviewed with the patient.   Follow-up plan: Return if symptoms worsen or fail to improve.  Note: Total time spent 25 minutes, greater than 50% of the visit was spent face-to-face counseling and coordinating care for the following: The primary encounter diagnosis was COPD with acute exacerbation (HCC). Diagnoses of Stage 2 moderate COPD by GOLD classification (HCC), Bronchitis, and Essential hypertension were also pertinent to this visit.[name]  Please note: voice recognition software was used to produce this document, and typos may escape review. Please contact Dr. Lyn Hollingshead for any needed clarifications.

## 2017-02-09 ENCOUNTER — Telehealth: Payer: Self-pay

## 2017-02-09 NOTE — Telephone Encounter (Signed)
At provider's request - called pt, notified chest x-ray was normal. No other inquiries asked during phone call.

## 2017-04-11 ENCOUNTER — Encounter: Payer: Self-pay | Admitting: Osteopathic Medicine

## 2017-04-11 ENCOUNTER — Ambulatory Visit (INDEPENDENT_AMBULATORY_CARE_PROVIDER_SITE_OTHER): Payer: Self-pay | Admitting: Osteopathic Medicine

## 2017-04-11 VITALS — BP 126/88 | HR 75 | Temp 98.5°F | Wt 267.1 lb

## 2017-04-11 DIAGNOSIS — F17200 Nicotine dependence, unspecified, uncomplicated: Secondary | ICD-10-CM

## 2017-04-11 DIAGNOSIS — I10 Essential (primary) hypertension: Secondary | ICD-10-CM

## 2017-04-11 DIAGNOSIS — R55 Syncope and collapse: Secondary | ICD-10-CM

## 2017-04-11 DIAGNOSIS — J449 Chronic obstructive pulmonary disease, unspecified: Secondary | ICD-10-CM

## 2017-04-11 DIAGNOSIS — I4510 Unspecified right bundle-branch block: Secondary | ICD-10-CM

## 2017-04-11 MED ORDER — BUPROPION HCL ER (XL) 300 MG PO TB24: 300.0000 mg | ORAL_TABLET | Freq: Every day | ORAL | 3 refills | Status: DC
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
## 2017-04-11 NOTE — Patient Instructions (Addendum)
Vasovagal Syncope, Adult  Syncope, which is commonly known as fainting or passing out, is a temporary loss of consciousness. It occurs when the blood flow to the brain is reduced. Vasovagal syncope, also called neurocardiogenic syncope, is a fainting spell that happens when blood flow to the brain is reduced because of a sudden drop in heart rate and blood pressure.  Vasovagal syncope is usually harmless. However, you can get injured if you fall during a fainting spell.  What are the causes?  This condition is caused by a drop in heart rate and blood pressure, usually in response to a trigger. Many things and situations can trigger an episode, including:  · Pain.  · Fear.  · The sight of blood. This may occur during medical procedures, such as when blood is being drawn from a vein.  · Common activities, such as coughing, swallowing, stretching, or going to the bathroom.  · Emotional stress.  · Being in a confined space.  · Prolonged standing, especially in a warm environment.  · Lack of sleep or rest.  · Not eating for a long time.  · Not drinking enough liquids.  · Recent illness.  · Drinking alcohol.  · Taking drugs that affect blood pressure, such as marijuana, cocaine, opiates, or inhalants.    What are the signs or symptoms?  Before a fainting episode, you may:  · Feel dizzy or light-headed.  · Become pale.  · Sense that you are going to faint.  · Feel like the room is spinning.  · Only see directly ahead (tunnel vision).  · Feel sick to your stomach (nauseous).  · See spots.  · Slowly lose vision.  · Hear ringing in your ears.  · Have a headache.  · Feel warm and sweaty.  · Feel a sensation of pins and needles.    During the fainting spell, you may twitch or make jerky movements. Fainting spells usually last no longer than a few minutes before you wake up. If you get up too quickly before your body can recover, you may faint again.  How is this diagnosed?  This condition is diagnosed based on your symptoms,  your medical history, and a physical exam. Tests may be done to rule out other causes of fainting. Tests may include:  · Blood tests.  · Heart tests, such as an electrocardiogram (ECG), echocardiogram, or electrophysiology study.  · A test to check your response to changes in position (tilt table test).    How is this treated?  Usually, treatment is not needed for this condition. Your health care provider may suggest ways to help prevent fainting episodes. These may include:  · Drinking additional fluids if you are exposed to a trigger.  · Sitting or lying down if you notice signs that an episode is coming.    If your fainting spells continue, your health care provider may recommend that you:  · Take medicines to prevent fainting or to help reduce further episodes of fainting.  · Do certain exercises.  · Wear compression stockings.  · Have surgery to place a pacemaker in your body (rare).    Follow these instructions at home:  · Learn to identify the signs that an episode is coming.  · Sit or lie down at the first sign of a fainting spell. If you sit down, put your head down between your legs. If you lie down, swing your legs up in the air to increase blood flow to the brain.  ·   Avoid hot tubs and saunas.  · Avoid standing for a long time. If you have to stand for a long time, try:  ? Crossing your legs.  ? Flexing and stretching your leg muscles.  ? Squatting.  ? Moving your legs.  ? Bending over.  · Drink enough fluid to keep your urine clear or pale yellow.  · Make changes to your diet that your health care provider recommends. You may be told to:  ? Avoid caffeine.  ? Eat more salt.  · Take over-the-counter and prescription medicines only as told by your health care provider.  Contact a health care provider if:  · You continue to have fainting spells despite treatment.  · You faint more often despite treatment.  · You lose consciousness for more than a few minutes.  · You faint during or after exercising or  after being startled.  · You have twitching or jerky movements for longer than a few seconds during a fainting spell.  · You have an episode of twitching or jerky movements without fainting.  Get help right away if:  · A fainting spell leads to an injury or bleeding.  · You have new symptoms that occur with the fainting spells, such as:  ? Shortness of breath.  ? Chest pain.  ? Irregular heartbeat.  · You twitch or make jerky movements for more than 5 minutes.  · You twitch or make jerky movements during more than one fainting spell.  This information is not intended to replace advice given to you by your health care provider. Make sure you discuss any questions you have with your health care provider.  Document Released: 12/08/2011 Document Revised: 06/04/2015 Document Reviewed: 10/19/2014  Elsevier Interactive Patient Education © 2018 Elsevier Inc.

## 2017-04-12 LAB — CBC WITH DIFFERENTIAL/PLATELET
BASOS PCT: 0.5 %
Basophils Absolute: 49 cells/uL (ref 0–200)
EOS PCT: 0.4 %
Eosinophils Absolute: 39 cells/uL (ref 15–500)
HCT: 46.7 % (ref 38.5–50.0)
Hemoglobin: 15.9 g/dL (ref 13.2–17.1)
Lymphs Abs: 4181 cells/uL — ABNORMAL HIGH (ref 850–3900)
MCH: 31.1 pg (ref 27.0–33.0)
MCHC: 34 g/dL (ref 32.0–36.0)
MCV: 91.4 fL (ref 80.0–100.0)
MONOS PCT: 7 %
MPV: 11.1 fL (ref 7.5–12.5)
Neutro Abs: 4753 cells/uL (ref 1500–7800)
Neutrophils Relative %: 49 %
PLATELETS: 308 10*3/uL (ref 140–400)
RBC: 5.11 10*6/uL (ref 4.20–5.80)
RDW: 12.7 % (ref 11.0–15.0)
TOTAL LYMPHOCYTE: 43.1 %
WBC: 9.7 10*3/uL (ref 3.8–10.8)
WBCMIX: 679 {cells}/uL (ref 200–950)

## 2017-04-12 LAB — COMPLETE METABOLIC PANEL WITH GFR
AG RATIO: 1.6 (calc) (ref 1.0–2.5)
ALKALINE PHOSPHATASE (APISO): 54 U/L (ref 40–115)
ALT: 16 U/L (ref 9–46)
AST: 15 U/L (ref 10–35)
Albumin: 4.2 g/dL (ref 3.6–5.1)
BILIRUBIN TOTAL: 0.5 mg/dL (ref 0.2–1.2)
BUN: 13 mg/dL (ref 7–25)
CHLORIDE: 103 mmol/L (ref 98–110)
CO2: 27 mmol/L (ref 20–32)
Calcium: 10.1 mg/dL (ref 8.6–10.3)
Creat: 1.13 mg/dL (ref 0.70–1.33)
GFR, EST AFRICAN AMERICAN: 83 mL/min/{1.73_m2} (ref >=60)
GFR, Est Non African American: 71 mL/min/{1.73_m2} (ref >=60)
GLUCOSE: 76 mg/dL (ref 65–99)
Globulin: 2.6 g/dL (calc) (ref 1.9–3.7)
Potassium: 4.2 mmol/L (ref 3.5–5.3)
Sodium: 138 mmol/L (ref 135–146)
TOTAL PROTEIN: 6.8 g/dL (ref 6.1–8.1)

## 2017-04-12 LAB — TSH: TSH: 1.01 m[IU]/L (ref 0.40–4.50)

## 2017-04-15 ENCOUNTER — Encounter: Payer: Self-pay | Admitting: Cardiology

## 2017-04-15 ENCOUNTER — Telehealth (HOSPITAL_COMMUNITY): Payer: Self-pay | Admitting: *Deleted

## 2017-04-15 ENCOUNTER — Ambulatory Visit (INDEPENDENT_AMBULATORY_CARE_PROVIDER_SITE_OTHER): Payer: Self-pay | Admitting: Cardiology

## 2017-04-15 VITALS — BP 126/82 | HR 84 | Ht 73.0 in | Wt 266.4 lb

## 2017-04-15 DIAGNOSIS — R9431 Abnormal electrocardiogram [ECG] [EKG]: Secondary | ICD-10-CM | POA: Insufficient documentation

## 2017-04-15 DIAGNOSIS — F17200 Nicotine dependence, unspecified, uncomplicated: Secondary | ICD-10-CM

## 2017-04-15 DIAGNOSIS — I10 Essential (primary) hypertension: Secondary | ICD-10-CM

## 2017-04-15 NOTE — Telephone Encounter (Signed)
Patient given detailed instructions per Myocardial Perfusion Study Information Sheet for the test on 04/18/17 at 7:45. Patient notified to arrive 15 minutes early and that it is imperative to arrive on time for appointment to keep from having the test rescheduled.  If you need to cancel or reschedule your appointment, please call the office within 24 hours of your appointment. . Patient verbalized understanding.Eric DolinSharon S Brooks

## 2017-04-15 NOTE — Progress Notes (Signed)
Cardiology Office Note:    Date:  04/15/2017   ID:  Eric Belfastichard Bowie, DOB 07/05/1960, MRN [id number]  PCP:  Sunnie NielsenAlexander, Natalie, DO  Cardiologist:  Garwin Brothersajan R Revankar, MD   Referring MD: Sunnie NielsenAlexander, Natalie, DO    ASSESSMENT:    1. Abnormal electrocardiogram (ECG) (EKG)   2. Essential hypertension   3. Tobacco dependence    PLAN:    In order of problems listed above:  1. Primary prevention stressed with the patient.  Importance of compliance with diet and medications stressed and he vocalized understanding. 2. His blood pressure stable. 3. Diet was discussed for dyslipidemia and abdominal obesity.  He vocalized understanding. 4. I spent 5 minutes with the patient discussing solely about smoking. Smoking cessation was counseled. I suggested to the patient also different medications and pharmacological interventions. Patient is keen to try stopping on its own at this time. He will get back to me if he needs any further assistance in this matter. 5. In view of the above and multiple risk factors for coronary artery disease, he will undergo exercise stress Cardiolite testing.  If this is negative he will be seen in follow-up only on a as needed basis.  He was advised to take a coated 81 mg aspirin on a daily basis.   Medication Adjustments/Labs and Tests Ordered: Current medicines are reviewed at length with the patient today.  Concerns regarding medicines are outlined above.  No orders of the defined types were placed in this encounter.  No orders of the defined types were placed in this encounter.    History of Present Illness:    Eric Baird is a 57 y.o. male who is being seen today for the evaluation of abnormal EKG at the request of Sunnie Nielsenlexander, Natalie, DO.  Patient is a pleasant 57 year old male.  He has past medical history of essential hypertension.  He also has a active heavy chronic smoker.  He leads a sedentary lifestyle.  He was found to have abnormal EKG and came here for  evaluation.  He complains of some shortness of breath on exertion.  This is been going on for some time.  He has history of COPD diagnosed by his other physicians.  No orthopnea or PND.  No chest pain.  At the time of my evaluation, the patient is alert awake oriented and in no distress.  Past Medical History:  Diagnosis Date  . Essential hypertension 11/03/2015  . Stage 2 moderate COPD by GOLD classification (HCC) 01/14/2016  . Tobacco dependence 11/03/2015    Past Surgical History:  Procedure Laterality Date  . KNEE SURGERY Right     Current Medications: Current Meds  Medication Sig  . albuterol (PROVENTIL HFA;VENTOLIN HFA) 108 (90 Base) MCG/ACT inhaler Inhale 1-2 puffs into the lungs every 6 (six) hours as needed for wheezing or shortness of breath.  [name]. azithromycin (ZITHROMAX) 250 MG tablet 2 tabs po on Day 1, then 1 tab daily Days 2 - 5  . budesonide-formoterol (SYMBICORT) 160-4.5 MCG/ACT inhaler Inhale 2 puffs into the lungs 2 (two) times daily.  [name]. buPROPion (WELLBUTRIN XL) 300 MG 24 hr tablet Take 1 tablet (300 mg total) by mouth daily.  [name]. guaiFENesin-codeine 100-10 MG/5ML syrup Take 5-10 mLs by mouth every 6 (six) hours as needed for cough.  [name]. ipratropium (ATROVENT) 0.06 % nasal spray Place 2 sprays into both nostrils 4 (four) times daily.  [name]. lisinopril (PRINIVIL,ZESTRIL) 10 MG tablet Take 1 tablet (10 mg total) by mouth daily.  [name]. loratadine (  CLARITIN) 10 MG tablet Take 1 tablet (10 mg total) by mouth 2 (two) times daily. For one week  . predniSONE (DELTASONE) 20 MG tablet Take 1 tablet (20 mg total) by mouth 2 (two) times daily with a meal.     Allergies:   Patient has no known allergies.   Social History   Socioeconomic History  . Marital status: Single    Spouse name: Not on file  . Number of children: Not on file  . Years of education: Not on file  . Highest education level: Not on file  Occupational History  . Not on file  Social Needs  . Financial resource strain:  Not on file  . Food insecurity:    Worry: Not on file    Inability: Not on file  . Transportation needs:    Medical: Not on file    Non-medical: Not on file  Tobacco Use  . Smoking status: Current Every Day Smoker    Packs/day: 1.00    Types: Cigarettes  . Smokeless tobacco: Never Used  Substance and Sexual Activity  . Alcohol use: No  . Drug use: No  . Sexual activity: Not on file  Lifestyle  . Physical activity:    Days per week: Not on file    Minutes per session: Not on file  . Stress: Not on file  Relationships  . Social connections:    Talks on phone: Not on file    Gets together: Not on file    Attends religious service: Not on file    Active member of club or organization: Not on file    Attends meetings of clubs or organizations: Not on file    Relationship status: Not on file  Other Topics Concern  . Not on file  Social History Narrative  . Not on file     Family History: The patient's Family history is unknown by patient.  ROS:   Please see the history of present illness.    All other systems reviewed and are negative.  EKGs/Labs/Other Studies Reviewed:    The following studies were reviewed today: EKG reveals right bundle branch block.  There are nonspecific ST-T changes.   Recent Labs: 04/11/2017: ALT 16; BUN 13; Creat 1.13; Hemoglobin 15.9; Platelets 308; Potassium 4.2; Sodium 138; TSH 1.01  Recent Lipid Panel    Component Value Date/Time   CHOL 203 (H) 11/07/2015 1110   TRIG 102 11/07/2015 1110   HDL 36 (L) 11/07/2015 1110   CHOLHDL 5.6 (H) 11/07/2015 1110   VLDL 20 11/07/2015 1110   LDLCALC 147 (H) 11/07/2015 1110    Physical Exam:    VS:  BP 126/82 (BP Location: Right Arm, Patient Position: Sitting, Cuff Size: Normal)   Pulse 84   Ht 6\' 1"  (1.854 m)   Wt 266 lb 6.4 oz (120.8 kg)   SpO2 98%   BMI 35.15 kg/m     Wt Readings from Last 3 Encounters:  04/15/17 266 lb 6.4 oz (120.8 kg)  04/11/17 267 lb 1.3 oz (121.1 kg)  02/08/17 266  lb 1.9 oz (120.7 kg)     GEN: Patient is in no acute distress HEENT: Normal NECK: No JVD; No carotid bruits LYMPHATICS: No lymphadenopathy CARDIAC: S1 S2 regular, 2/6 systolic murmur at the apex. RESPIRATORY:  Clear to auscultation without rales, wheezing or rhonchi  ABDOMEN: Soft, non-tender, non-distended MUSCULOSKELETAL:  No edema; No deformity  SKIN: Warm and dry NEUROLOGIC:  Alert and oriented x 3 PSYCHIATRIC:  Normal  affect    Signed, Garwin Brothers, MD  04/15/2017 9:10 AM    [facility]

## 2017-04-15 NOTE — Patient Instructions (Signed)
Medication Instructions:  Your physician recommends that you continue on your current medications as directed. Please refer to the Current Medication list given to you today.  Labwork: None  Testing/Procedures: Your physician has requested that you have en exercise stress myoview. For further information please visit [email or web address]. Please follow instruction sheet, as given.  Follow-Up: Your physician recommends that you schedule a follow-up appointment in: as needed  Any Other Special Instructions Will Be Listed Below (If Applicable).     If you need a refill on your cardiac medications before your next appointment, please call your pharmacy.   [facility]  Garey HamAshley A, RN, BSN

## 2017-04-18 ENCOUNTER — Ambulatory Visit (HOSPITAL_COMMUNITY): Payer: Self-pay | Attending: Cardiovascular Disease

## 2017-04-18 VITALS — Ht 73.0 in | Wt 266.0 lb

## 2017-04-18 DIAGNOSIS — I10 Essential (primary) hypertension: Secondary | ICD-10-CM

## 2017-04-18 DIAGNOSIS — R9431 Abnormal electrocardiogram [ECG] [EKG]: Secondary | ICD-10-CM | POA: Insufficient documentation

## 2017-04-18 LAB — MYOCARDIAL PERFUSION IMAGING
CHL CUP NUCLEAR SSS: 2
LV dias vol: 116 mL (ref 62–150)
LVSYSVOL: 43 mL
NUC STRESS TID: 0.99
Peak HR: 113 {beats}/min
RATE: 0.29
Rest HR: 68 {beats}/min
SDS: 2
SRS: 0

## 2017-04-18 MED ORDER — TECHNETIUM TC 99M TETROFOSMIN IV KIT
32.3000 | PACK | Freq: Once | INTRAVENOUS | Status: AC | PRN
  Administered 2017-04-18: 32.3 via INTRAVENOUS
  Filled 2017-04-18: qty 33

## 2017-04-18 MED ORDER — TECHNETIUM TC 99M TETROFOSMIN IV KIT
10.4000 | PACK | Freq: Once | INTRAVENOUS | Status: AC | PRN
  Administered 2017-04-18: 10.4 via INTRAVENOUS
  Filled 2017-04-18: qty 11

## 2017-04-18 MED ORDER — REGADENOSON 0.4 MG/5ML IV SOLN
0.4000 mg | Freq: Once | INTRAVENOUS | Status: AC
  Administered 2017-04-18: 0.4 mg via INTRAVENOUS

## 2017-10-10 ENCOUNTER — Ambulatory Visit (INDEPENDENT_AMBULATORY_CARE_PROVIDER_SITE_OTHER): Payer: Self-pay | Admitting: Family Medicine

## 2017-10-10 ENCOUNTER — Encounter: Payer: Self-pay | Admitting: Family Medicine

## 2017-10-10 VITALS — BP 142/75 | HR 76 | Ht 72.0 in | Wt 272.0 lb

## 2017-10-10 DIAGNOSIS — S39012A Strain of muscle, fascia and tendon of lower back, initial encounter: Secondary | ICD-10-CM

## 2017-10-10 MED ORDER — CYCLOBENZAPRINE HCL 10 MG PO TABS: 10.0000 mg | ORAL_TABLET | Freq: Three times a day (TID) | ORAL | 0 refills | Status: DC | PRN

## 2017-10-10 NOTE — Progress Notes (Signed)
Eric Baird is a 57 y.o. male who presents to [facility] today for back pain.   He has had significant back pain since he was playing golf on Friday. This has never happened to him before and he has been extremely stiff and painful. He has tired ibuprofen which has provided minimal relief. The pain is keeping him up at night. He denies shooting or burning pain. Denies bowel or bladder dysfunction. The pain goes from his hips to his mid back and is present on both sides. He says it is a little better today than when it happened on Friday but the pain is still severe.     ROS:  As above  Exam:  BP (!) 142/75   Pulse 76   Ht 6' (1.829 m)   Wt 272 lb (123.4 kg)   BMI 36.89 kg/m  General: Well Developed, well nourished, and in no acute distress.  Neuro/Psych: Alert and oriented x3, extra-ocular muscles intact, able to move all 4 extremities, sensation grossly intact. Skin: Warm and dry, no rashes noted.  Respiratory: Not using accessory muscles, speaking in full sentences, trachea midline.  Cardiovascular: Pulses palpable, no extremity edema. Abdomen: Does not appear distended. L-spine: normal appearing with no obvious deformities  Non tender to palpation at the spinal midline  ROM limited by pain in all directions. He is very stiff  Pain with deep squat.     Lab and Radiology Results No results found for this or any previous visit (from the past 72 hour(s)). No results found.     Assessment and Plan: 57 y.o. male with low back pain secondary to muscle strain following playing golf. The plan will be to continue taking ibuprofen as needed, to add flexeril, heating pad, and a TENs unit. He will also attend physical therapy. He was advised that this should take a couple weeks to resolve but he should return to clinic if he worsens or does not improve.   He denied the influenza vaccine today, saying it is too expensive and that he will  get it at [facility].     Orders Placed This Encounter  Procedures  . Ambulatory referral to Physical Therapy    Referral Priority:   Routine    Referral Type:   Physical Medicine    Referral Reason:   Specialty Services Required    Requested Specialty:   Physical Therapy   Meds ordered this encounter  Medications  . cyclobenzaprine (FLEXERIL) 10 MG tablet    Sig: Take 1 tablet (10 mg total) by mouth 3 (three) times daily as needed for muscle spasms.    Dispense:  30 tablet    Refill:  0    Historical information moved to improve visibility of documentation.  Past Medical History:  Diagnosis Date  . Essential hypertension 11/03/2015  . Stage 2 moderate COPD by GOLD classification (HCC) 01/14/2016  . Tobacco dependence 11/03/2015   Past Surgical History:  Procedure Laterality Date  . KNEE SURGERY Right    Social History   Tobacco Use  . Smoking status: Current Every Day Smoker    Packs/day: 1.00    Types: Cigarettes  . Smokeless tobacco: Never Used  Substance Use Topics  . Alcohol use: No   Family history is unknown by patient.  Medications: Current Outpatient Medications  Medication Sig Dispense Refill  . albuterol (PROVENTIL HFA;VENTOLIN HFA) 108 (90 Base) MCG/ACT inhaler Inhale 1-2 puffs into the lungs every 6 (six) hours as needed  for wheezing or shortness of breath. 1 Inhaler 6  . budesonide-formoterol (SYMBICORT) 160-4.5 MCG/ACT inhaler Inhale 2 puffs into the lungs 2 (two) times daily. 1 Inhaler 3  . buPROPion (WELLBUTRIN XL) 300 MG 24 hr tablet Take 1 tablet (300 mg total) by mouth daily. 90 tablet 3  . ipratropium (ATROVENT) 0.06 % nasal spray Place 2 sprays into both nostrils 4 (four) times daily. 15 mL 0  . lisinopril (PRINIVIL,ZESTRIL) 10 MG tablet Take 1 tablet (10 mg total) by mouth daily. 90 tablet 3  . loratadine (CLARITIN) 10 MG tablet Take 1 tablet (10 mg total) by mouth 2 (two) times daily. For one week 15 tablet 0  . cyclobenzaprine (FLEXERIL) 10 MG  tablet Take 1 tablet (10 mg total) by mouth 3 (three) times daily as needed for muscle spasms. 30 tablet 0   No current facility-administered medications for this visit.    No Known Allergies    Discussed warning signs or symptoms. Please see discharge instructions. Patient expresses understanding.  I personally was present and performed or re-performed the history, physical exam and medical decision-making activities of this service and have verified that the service and findings are accurately documented in the student's note. ___________________________________________ Clementeen Graham M.D., ABFM., CAQSM. Primary Care and Sports Medicine Adjunct Instructor of Family Medicine  [employer]

## 2017-10-10 NOTE — Patient Instructions (Signed)
Thank you for coming in today. Attend PT Use a heating pad.  Use a TENS unit.  Use flexeril at bedtime as needed.  Continue tylenol or ibuprofen for pain.   TENS UNIT: This is helpful for muscle pain and spasm.   Search and Purchase a TENS 7000 2nd edition at  www.tenspros.com or www.Amazon.com It should be less than $30.     TENS unit instructions: Do not shower or bathe with the unit on Turn the unit off before removing electrodes or batteries If the electrodes lose stickiness add a drop of water to the electrodes after they are disconnected from the unit and place on plastic sheet. If you continued to have difficulty, call the TENS unit company to purchase more electrodes. Do not apply lotion on the skin area prior to use. Make sure the skin is clean and dry as this will help prolong the life of the electrodes. After use, always check skin for unusual red areas, rash or other skin difficulties. If there are any skin problems, does not apply electrodes to the same area. Never remove the electrodes from the unit by pulling the wires. Do not use the TENS unit or electrodes other than as directed. Do not change electrode placement without consultating your therapist or physician. Keep 2 fingers with between each electrode. Wear time ratio is 2:1, on to off times.    For example on for 30 minutes off for 15 minutes and then on for 30 minutes off for 15 minutes     Lumbosacral Strain Lumbosacral strain is an injury that causes pain in the lower back (lumbosacral spine). This injury usually occurs from overstretching the muscles or ligaments along your spine. A strain can affect one or more muscles or cord-like tissues that connect bones to other bones (ligaments). What are the causes? This condition may be caused by:  A hard, direct hit (blow) to the back.  Excessive stretching of the lower back muscles. This may result from: ? A fall. ? Lifting something heavy. ? Repetitive  movements such as bending or crouching.  What increases the risk? The following factors may increase your risk of getting this condition:  Participating in sports or activities that involve: ? A sudden twist of the back. ? Pushing or pulling motions.  Being overweight or obese.  Having poor strength and flexibility, especially tight hamstrings or weak muscles in the back or abdomen.  Having too much of a curve in the lower back.  Having a pelvis that is tilted forward.  What are the signs or symptoms? The main symptom of this condition is pain in the lower back, at the site of the strain. Pain may extend (radiate) down one or both legs. How is this diagnosed? This condition is diagnosed based on:  Your symptoms.  Your medical history.  A physical exam. ? Your health care provider may push on certain areas of your back to determine the source of your pain. ? You may be asked to bend forward, backward, and side to side to assess the severity of your pain and your range of motion.  Imaging tests, such as: ? X-rays. ? MRI.  How is this treated? Treatment for this condition may include:  Putting heat and cold on the affected area.  Medicines to help relieve pain and relax your muscles (muscle relaxants).  NSAIDs to help reduce swelling and discomfort.  When your symptoms improve, it is important to gradually return to your normal routine as soon  as possible to reduce pain, avoid stiffness, and avoid loss of muscle strength. Generally, symptoms should improve within 6 weeks of treatment. However, recovery time varies. Follow these instructions at home: Managing pain, stiffness, and swelling   If directed, put ice on the injured area during the first 24 hours after your strain. ? Put ice in a plastic bag. ? Place a towel between your skin and the bag. ? Leave the ice on for 20 minutes, 2-3 times a day.  If directed, put heat on the affected area as often as told by your  health care provider. Use the heat source that your health care provider recommends, such as a moist heat pack or a heating pad. ? Place a towel between your skin and the heat source. ? Leave the heat on for 20-30 minutes. ? Remove the heat if your skin turns bright red. This is especially important if you are unable to feel pain, heat, or cold. You may have a greater risk of getting burned. Activity  Rest and return to your normal activities as told by your health care provider. Ask your health care provider what activities are safe for you.  Avoid activities that take a lot of energy for as long as told by your health care provider. General instructions  Take over-the-counter and prescription medicines only as told by your health care provider.  Donot drive or use heavy machinery while taking prescription pain medicine.  Do not use any products that contain nicotine or tobacco, such as cigarettes and e-cigarettes. If you need help quitting, ask your health care provider.  Keep all follow-up visits as told by your health care provider. This is important. How is this prevented?  Use correct form when playing sports and lifting heavy objects.  Use good posture when sitting and standing.  Maintain a healthy weight.  Sleep on a mattress with medium firmness to support your back.  Be safe and responsible while being active to avoid falls.  Do at least 150 minutes of moderate-intensity exercise each week, such as brisk walking or water aerobics. Try a form of exercise that takes stress off your back, such as swimming or stationary cycling.  Maintain physical fitness, including: ? Strength. ? Flexibility. ? Cardiovascular fitness. ? Endurance. Contact a health care provider if:  Your back pain does not improve after 6 weeks of treatment.  Your symptoms get worse. Get help right away if:  Your back pain is severe.  You cannot stand or walk.  You have difficulty controlling  when you urinate or when you have a bowel movement.  You feel nauseous or you vomit.  Your feet get very cold.  You have numbness, tingling, weakness, or problems using your arms or legs.  You develop any of the following: ? Shortness of breath. ? Dizziness. ? Pain in your legs. ? Weakness in your buttocks or legs. ? Discoloration of the skin on your toes or legs. This information is not intended to replace advice given to you by your health care provider. Make sure you discuss any questions you have with your health care provider. Document Released: 09/30/2004 Document Revised: 07/11/2015 Document Reviewed: 05/25/2015 Elsevier Interactive Patient Education  [employer].

## 2018-01-11 ENCOUNTER — Other Ambulatory Visit: Payer: Self-pay | Admitting: Osteopathic Medicine

## 2018-02-17 ENCOUNTER — Ambulatory Visit (INDEPENDENT_AMBULATORY_CARE_PROVIDER_SITE_OTHER): Payer: Self-pay | Admitting: Sports Medicine

## 2018-02-17 ENCOUNTER — Ambulatory Visit (INDEPENDENT_AMBULATORY_CARE_PROVIDER_SITE_OTHER): Payer: Self-pay

## 2018-02-17 ENCOUNTER — Encounter: Payer: Self-pay | Admitting: Sports Medicine

## 2018-02-17 DIAGNOSIS — J449 Chronic obstructive pulmonary disease, unspecified: Secondary | ICD-10-CM

## 2018-02-17 DIAGNOSIS — F17200 Nicotine dependence, unspecified, uncomplicated: Secondary | ICD-10-CM

## 2018-02-17 DIAGNOSIS — F17210 Nicotine dependence, cigarettes, uncomplicated: Secondary | ICD-10-CM

## 2018-02-17 MED ORDER — GLYCOPYRROLATE-FORMOTEROL 9-4.8 MCG/ACT IN AERO: 1.0000 | INHALATION_SPRAY | Freq: Every day | RESPIRATORY_TRACT | 11 refills | Status: DC

## 2018-02-17 MED ORDER — PREDNISONE 50 MG PO TABS: 50.0000 mg | ORAL_TABLET | Freq: Every day | ORAL | 0 refills | Status: DC

## 2018-02-17 MED ORDER — VARENICLINE TARTRATE 1 MG PO TABS: 1.0000 mg | ORAL_TABLET | Freq: Two times a day (BID) | ORAL | 3 refills | Status: DC

## 2018-02-17 MED ORDER — DOXYCYCLINE HYCLATE 100 MG PO TABS: 100.0000 mg | ORAL_TABLET | Freq: Two times a day (BID) | ORAL | 0 refills | Status: AC

## 2018-02-17 MED ORDER — VARENICLINE TARTRATE 0.5 MG X 11 & 1 MG X 42 PO MISC: ORAL | 0 refills | Status: DC

## 2018-02-17 NOTE — Assessment & Plan Note (Signed)
I'm also going to add Chantix for smoking cessation.  Discount coupon given.

## 2018-02-17 NOTE — Progress Notes (Signed)
Subjective:    CC: Cough and shortness of breath  HPI: Blu is a 58 year old male, he is an avid smoker.  He has known COPD, has not been able to afford Symbicort and has been occasionally getting samples from [place].  He admits to not taking this as ordered.  More recently he has had increasing cough, shortness of breath, wheezing.  No muscle aches or body aches, no fevers or chills.  No chest pain.  Cough is minimally productive.    He has tried Chantix in the past, it was too expensive.  I reviewed the past medical history, family history, social history, surgical history, and allergies today and no changes were needed.  Please see the problem list section below in epic for further details.  Past Medical History: Past Medical History:  Diagnosis Date  . Essential hypertension 11/03/2015  . Stage 2 moderate COPD by GOLD classification (HCC) 01/14/2016  . Tobacco dependence 11/03/2015   Past Surgical History: Past Surgical History:  Procedure Laterality Date  . KNEE SURGERY Right    Social History: Social History   Socioeconomic History  . Marital status: Single    Spouse name: Not on file  . Number of children: Not on file  . Years of education: Not on file  . Highest education level: Not on file  Occupational History  . Not on file  Social Needs  . Financial resource strain: Not on file  . Food insecurity:    Worry: Not on file    Inability: Not on file  . Transportation needs:    Medical: Not on file    Non-medical: Not on file  Tobacco Use  . Smoking status: Current Every Day Smoker    Packs/day: 1.00    Types: Cigarettes  . Smokeless tobacco: Never Used  Substance and Sexual Activity  . Alcohol use: No  . Drug use: No  . Sexual activity: Not on file  Lifestyle  . Physical activity:    Days per week: Not on file    Minutes per session: Not on file  . Stress: Not on file  Relationships  . Social connections:    Talks on phone: Not on file    Gets  together: Not on file    Attends religious service: Not on file    Active member of club or organization: Not on file    Attends meetings of clubs or organizations: Not on file    Relationship status: Not on file  Other Topics Concern  . Not on file  Social History Narrative  . Not on file   Family History: Family History  Family history unknown: Yes   Allergies: No Known Allergies Medications: See med rec.  Review of Systems: No fevers, chills, night sweats, weight loss, chest pain, or shortness of breath.   Objective:    General: Well Developed, well nourished, and in no acute distress.  Neuro: Alert and oriented x3, extra-ocular muscles intact, sensation grossly intact.  HEENT: Normocephalic, atraumatic, pupils equal round reactive to light, neck supple, no masses, no lymphadenopathy, thyroid nonpalpable.  Oropharynx, nasopharynx and ear canals unremarkable Skin: Warm and dry, no rashes. Cardiac: Regular rate and rhythm, no murmurs rubs or gallops, no lower extremity edema.  Respiratory: Very coarse breath sounds with diffuse inspiratory and expiratory wheezes. Not using accessory muscles, speaking in full sentences.  Impression and Recommendations:    Stage 2 moderate COPD by GOLD classification (HCC) Currently in exacerbation. He has been doing Symbicort samples. We  are going to switch to LABA/LAMA, with Bevespi, and a $0 co-pay discount coupon. Treating exacerbation, chest x-ray, prednisone, doxycycline. Return to see myself or Dr. Lyn Hollingshead to let us know how things are going, I'm also going to add Chantix for smoking cessation.  Discount coupon given.  Tobacco dependence I'm also going to add Chantix for smoking cessation.  Discount coupon given. ___________________________________________ Ihor Austin. Benjamin Stain, M.D., ABFM., CAQSM. Primary Care and Sports Medicine [facility]  Adjunct Professor of Family Medicine  [employer]

## 2018-02-17 NOTE — Assessment & Plan Note (Signed)
Currently in exacerbation. He has been doing Symbicort samples. We are going to switch to LABA/LAMA, with Bevespi, and a $0 co-pay discount coupon. Treating exacerbation, chest x-ray, prednisone, doxycycline. Return to see myself or Dr. Lyn Hollingshead to let us know how things are going, I'm also going to add Chantix for smoking cessation.  Discount coupon given.

## 2018-03-08 ENCOUNTER — Other Ambulatory Visit: Payer: Self-pay | Admitting: Osteopathic Medicine

## 2018-03-16 ENCOUNTER — Encounter: Payer: Self-pay | Admitting: Osteopathic Medicine

## 2018-03-16 ENCOUNTER — Ambulatory Visit (INDEPENDENT_AMBULATORY_CARE_PROVIDER_SITE_OTHER): Payer: Self-pay | Admitting: Osteopathic Medicine

## 2018-03-16 ENCOUNTER — Other Ambulatory Visit: Payer: Self-pay

## 2018-03-16 VITALS — BP 100/62 | HR 92 | Temp 99.0°F | Wt 265.6 lb

## 2018-03-16 DIAGNOSIS — J449 Chronic obstructive pulmonary disease, unspecified: Secondary | ICD-10-CM

## 2018-03-16 DIAGNOSIS — R5081 Fever presenting with conditions classified elsewhere: Secondary | ICD-10-CM

## 2018-03-16 DIAGNOSIS — J441 Chronic obstructive pulmonary disease with (acute) exacerbation: Secondary | ICD-10-CM

## 2018-03-16 MED ORDER — LEVOFLOXACIN 500 MG PO TABS: 500.0000 mg | ORAL_TABLET | Freq: Every day | ORAL | 0 refills | Status: DC

## 2018-03-16 MED ORDER — BUDESONIDE-FORMOTEROL FUMARATE 160-4.5 MCG/ACT IN AERO: 2.0000 | INHALATION_SPRAY | Freq: Two times a day (BID) | RESPIRATORY_TRACT | 3 refills | Status: DC

## 2018-03-16 MED ORDER — PREDNISONE 50 MG PO TABS: 50.0000 mg | ORAL_TABLET | Freq: Every day | ORAL | 0 refills | Status: DC

## 2018-03-16 NOTE — Patient Instructions (Signed)
Plan:   We will restart Symbicort samples, twice daily.  Steroid burst for 5 days.  Antibiotics to cover for bronchitis or possible pneumonia.  Okay to take guaifenesin-dextromethorphan (Mucinex DM or Robitussin DM) for cough as well.

## 2018-03-16 NOTE — Progress Notes (Signed)
HPI: Eric Baird is a 58 y.o. male who  has a past medical history of Essential hypertension (11/03/2015), Stage 2 moderate COPD by GOLD classification (HCC) (01/14/2016), and Tobacco dependence (11/03/2015).  he presents to [facility] today, 03/16/18,  for chief complaint of: Sick, cough   . Context: is a smoker  . Location/Quality: dry cough and fever  . Duration: 2 weeks  . Timing: intermittent  . Modifying factors: no OTC meds tried  . Assoc signs/symptoms:   Social History   Tobacco Use  Smoking Status Current Every Day Smoker  . Packs/day: 1.00  . Types: Cigarettes  Smokeless Tobacco Never Used       Past medical, surgical, social and family history reviewed and updated as necessary.   Current medication list and allergy/intolerance information reviewed:    Current Outpatient Medications  Medication Sig Dispense Refill  . buPROPion (WELLBUTRIN XL) 300 MG 24 hr tablet Take 1 tablet (300 mg total) by mouth daily. 90 tablet 3  . ipratropium (ATROVENT) 0.06 % nasal spray Place 2 sprays into both nostrils 4 (four) times daily. 15 mL 0  . lisinopril (PRINIVIL,ZESTRIL) 10 MG tablet TAKE 1 TABLET BY MOUTH ONCE DAILY, DUE FOR FOLLOW UP VISIT WITH PCP 15 tablet 0  . albuterol (PROVENTIL HFA;VENTOLIN HFA) 108 (90 Base) MCG/ACT inhaler Inhale 1-2 puffs into the lungs every 6 (six) hours as needed for wheezing or shortness of breath. (Patient not taking: Reported on 03/16/2018) 1 Inhaler 6  . budesonide-formoterol (SYMBICORT) 160-4.5 MCG/ACT inhaler Inhale 2 puffs into the lungs 2 (two) times daily. 1 Inhaler 3  . levofloxacin (LEVAQUIN) 500 MG tablet Take 1 tablet (500 mg total) by mouth daily. 7 tablet 0  . predniSONE (DELTASONE) 50 MG tablet Take 1 tablet (50 mg total) by mouth daily. 5 tablet 0   No current facility-administered medications for this visit.     No Known Allergies    Review of Systems:  Constitutional:  +subjective  fever, +chills, +recent illness, No unintentional weight changes. +significant fatigue.   HEENT: No  headache, no vision change, no hearing change, No sore throat, +sinus pressure  Cardiac: No  chest pain, No  pressure, No palpitations  Respiratory:  No  shortness of breath. +Cough  Gastrointestinal: No  abdominal pain, No  nausea, No  vomiting,  No  blood in stool, No  diarrhea  Musculoskeletal: No new myalgia/arthralgia  Skin: No  Rash  Neurologic: No  weakness, No  dizziness  Exam:  BP 100/62 (BP Location: Left Arm, Patient Position: Sitting, Cuff Size: Normal)   Pulse 92   Temp 99 F (37.2 C) (Oral)   Wt 265 lb 9.6 oz (120.5 kg)   BMI 36.02 kg/m   Constitutional: VS see above. General Appearance: alert, well-developed, well-nourished, NAD  Eyes: Normal lids and conjunctive, non-icteric sclera  Ears, Nose, Mouth, Throat: MMM, Normal external inspection ears/nares/mouth/lips/gums. TM normal bilaterally. Pharynx/tonsils +erythema, no exudate. Nasal mucosa normal.   Neck: No masses, trachea midline. No tenderness/mass appreciated. No lymphadenopathy  Respiratory: Normal respiratory effort. +diffuse wheeze and coarse breath sounds a bit worse on L middle lung auscultation, no rhonchi, no rales  Cardiovascular: S1/S2 normal, no murmur, no rub/gallop auscultated. RRR. No lower extremity edema.   Musculoskeletal: Gait normal.   Neurological: Normal balance/coordination. No tremor.   Skin: warm, dry, intact.   Psychiatric: Normal judgment/insight. Normal mood and affect.       ASSESSMENT/PLAN: The primary encounter diagnosis was COPD exacerbation (HCC).  Diagnoses of Stage 2 moderate COPD by GOLD classification (HCC) and Fever in other diseases were also pertinent to this visit.   Patient is self-pay, would like to avoid x-ray unless absolutely necessary.  We will go ahead and cover for sinusitis/community-acquired pneumonia  Meds ordered this encounter  Medications   . predniSONE (DELTASONE) 50 MG tablet    Sig: Take 1 tablet (50 mg total) by mouth daily.    Dispense:  5 tablet    Refill:  0  . budesonide-formoterol (SYMBICORT) 160-4.5 MCG/ACT inhaler    Sig: Inhale 2 puffs into the lungs 2 (two) times daily.    Dispense:  1 Inhaler    Refill:  3  . levofloxacin (LEVAQUIN) 500 MG tablet    Sig: Take 1 tablet (500 mg total) by mouth daily.    Dispense:  7 tablet    Refill:  0    No orders of the defined types were placed in this encounter.   Patient Instructions  Plan:   We will restart Symbicort samples, twice daily.  Steroid burst for 5 days.  Antibiotics to cover for bronchitis or possible pneumonia.  Okay to take guaifenesin-dextromethorphan (Mucinex DM or Robitussin DM) for cough as well.        Visit summary with medication list and pertinent instructions was printed for patient to review. All questions at time of visit were answered - patient instructed to contact office with any additional concerns or updates. ER/RTC precautions were reviewed with the patient.   Follow-up plan: Return if symptoms worsen or fail to improve.    Please note: voice recognition software was used to produce this document, and typos may escape review. Please contact Dr. Lyn Hollingshead for any needed clarifications.

## 2018-03-22 ENCOUNTER — Other Ambulatory Visit: Payer: Self-pay | Admitting: Osteopathic Medicine

## 2018-03-22 ENCOUNTER — Ambulatory Visit (INDEPENDENT_AMBULATORY_CARE_PROVIDER_SITE_OTHER): Payer: Self-pay

## 2018-03-22 ENCOUNTER — Telehealth: Payer: Self-pay

## 2018-03-22 ENCOUNTER — Other Ambulatory Visit: Payer: Self-pay

## 2018-03-22 DIAGNOSIS — R0602 Shortness of breath: Secondary | ICD-10-CM

## 2018-03-22 DIAGNOSIS — F17200 Nicotine dependence, unspecified, uncomplicated: Secondary | ICD-10-CM

## 2018-03-22 DIAGNOSIS — J449 Chronic obstructive pulmonary disease, unspecified: Secondary | ICD-10-CM

## 2018-03-22 DIAGNOSIS — I10 Essential (primary) hypertension: Secondary | ICD-10-CM

## 2018-03-22 NOTE — Telephone Encounter (Signed)
Patient advised of recommendations. He will go ahead and have the chest xray. Order placed.

## 2018-03-22 NOTE — Telephone Encounter (Signed)
Eric Baird did go ahead and do the chest X-ray. He would like a note to return to work.

## 2018-03-22 NOTE — Telephone Encounter (Signed)
I cannot clear anyone to return to work if they have not been fever-free without meds for 48 hours, sorry.

## 2018-03-22 NOTE — Telephone Encounter (Signed)
Eric Baird called and states he is still having fevers and shortness of breath. His work is not wanting him to be there until his symptoms resolve or he has a negative coronavirus test. Please advise.

## 2018-03-22 NOTE — Telephone Encounter (Signed)
Per our protocol, he is still considered LOW risk given he has not traveled or been in close contact w/ someone who has tested positive.   If he is having true fever and lower respiratory issues and wants to be seen, he should come in to be swabbed for flu and then possibly swabbed for coronavirus if his flu test is negative (we are following CDC protocol - his employer cannot demand that he has negative coronavirus testing). Would also strongly consider chest Xray.   The only other option if he does not feel sick enough to come in is to self-quarantine for 14 days and seek medical care if he gets worse.

## 2018-03-22 NOTE — Telephone Encounter (Signed)
Patient advised.

## 2018-03-23 NOTE — Telephone Encounter (Signed)
Thanks!  Re: return to work - If he is required to have a note, I will provide one but can should direct his employer to the [facility] recommendations below and should be able to just go back to work.     Please refer to the [facility] website for business guidance re: coronavirus and update your relevant policies for absence management for sick employees.  [employer] Also googling "[employer] coronavirus business" brings this up  Excerpt from [facility] guidelines as follows:   "Interim Guidance for Businesses and Employers... Recommended strategies for employers to use now:  Actively encourage sick employees to stay home: ? Employees who have symptoms of acute respiratory illness are recommended to stay home and not come to work until they are free of fever (100.4 F [38.0 C] or greater using an oral thermometer), signs of a fever, and any other symptoms for at least 24 hours, without the use of fever-reducing or other symptom-altering medicines (e.g. cough suppressants). Employees should notify their supervisor and stay home if they are sick... ? Do not require a healthcare provider's note for employees who are sick with acute respiratory illness to validate their illness or to return to work, as healthcare provider offices and medical facilities may be extremely busy and not able to provide such documentation in a timely way."  Please see full information on the web site.

## 2018-03-23 NOTE — Telephone Encounter (Signed)
Eric Baird states he is feeling better. His last fever was yesterday morning about 6 am. He hasn't had any fever reducing medication since before yesterday. He is feeling much better. He hopes he will be able to return on Monday to work.

## 2018-03-24 NOTE — Telephone Encounter (Signed)
Patient advised and letter printed.  

## 2018-07-04 ENCOUNTER — Other Ambulatory Visit: Payer: Self-pay | Admitting: Osteopathic Medicine

## 2018-07-04 NOTE — Telephone Encounter (Signed)
Please advise 

## 2018-08-07 ENCOUNTER — Other Ambulatory Visit: Payer: Self-pay | Admitting: Osteopathic Medicine

## 2018-08-07 DIAGNOSIS — F17200 Nicotine dependence, unspecified, uncomplicated: Secondary | ICD-10-CM

## 2018-09-14 ENCOUNTER — Other Ambulatory Visit: Payer: Self-pay | Admitting: Osteopathic Medicine

## 2018-09-19 ENCOUNTER — Other Ambulatory Visit: Payer: Self-pay

## 2018-09-19 ENCOUNTER — Other Ambulatory Visit: Payer: Self-pay | Admitting: Osteopathic Medicine

## 2018-09-19 MED ORDER — LISINOPRIL 10 MG PO TABS: ORAL_TABLET | ORAL | 0 refills | Status: DC

## 2018-09-25 ENCOUNTER — Other Ambulatory Visit: Payer: Self-pay

## 2018-09-25 ENCOUNTER — Ambulatory Visit (INDEPENDENT_AMBULATORY_CARE_PROVIDER_SITE_OTHER): Payer: Self-pay | Admitting: Physician Assistant

## 2018-09-25 ENCOUNTER — Encounter: Payer: Self-pay | Admitting: Physician Assistant

## 2018-09-25 VITALS — Temp 99.9°F | Ht 72.0 in | Wt 265.0 lb

## 2018-09-25 DIAGNOSIS — R5383 Other fatigue: Secondary | ICD-10-CM

## 2018-09-25 DIAGNOSIS — R195 Other fecal abnormalities: Secondary | ICD-10-CM

## 2018-09-25 DIAGNOSIS — Z20822 Contact with and (suspected) exposure to covid-19: Secondary | ICD-10-CM

## 2018-09-25 DIAGNOSIS — R509 Fever, unspecified: Secondary | ICD-10-CM

## 2018-09-25 DIAGNOSIS — J441 Chronic obstructive pulmonary disease with (acute) exacerbation: Secondary | ICD-10-CM

## 2018-09-25 MED ORDER — PREDNISONE 50 MG PO TABS: ORAL_TABLET | ORAL | 0 refills | Status: DC

## 2018-09-25 MED ORDER — AZITHROMYCIN 250 MG PO TABS: ORAL_TABLET | ORAL | 0 refills | Status: DC

## 2018-09-25 NOTE — Progress Notes (Signed)
Patient ID: Eric Baird, male   DOB: November 11, 1960, 58 y.o.   MRN: [id number] .Marland KitchenVirtual Visit via Telephone Note  I connected with Eric Baird on 09/25/18 at  2:40 PM EDT by telephone and verified that I am speaking with the correct person using two identifiers.  Location: Patient: home Provider: clinic   I discussed the limitations, risks, security and privacy concerns of performing an evaluation and management service by telephone and the availability of in person appointments. I also discussed with the patient that there may be a patient responsible charge related to this service. The patient expressed understanding and agreed to proceed.   History of Present Illness: Patient is a 58 year old male with hypertension, COPD who comes into the clinic with productive cough, fever, chills, fatigue for the last 4 days.  Patient is a daily smoker and has history of COPD exacerbations.  He states this exacerbation feels a little different.  He feels like he is a little bit weaker.  He continues to cough up lots of phlegm.  He reports no recent loss of smell but he really never has good smell.  He has had some loose stools.  He does work in Scientist, research (physical sciences) but denies any direct COVID contact.  He denies any other sick contacts in home.  He continues to use his albuterol as needed and Symbicort daily.  .. Active Ambulatory Problems    Diagnosis Date Noted  . Essential hypertension 11/03/2015  . Tobacco dependence 11/03/2015  . Stage 2 moderate COPD by GOLD classification ([place]) 01/14/2016  . Fatigue 07/29/2016  . Generalized muscle ache 07/29/2016  . Abnormal electrocardiogram (ECG) (EKG) 04/15/2017   Resolved Ambulatory Problems    Diagnosis Date Noted  . Fever chills 07/29/2016   No Additional Past Medical History   Reviewed med, allergy, problem list.    Observations/Objective: No acute distress. Productive cough on phone call. Pt did not sound like breathing was labored.   . Today's Vitals   09/25/18 1417  Temp: 99.9 F (37.7 C)  TempSrc: Oral  Weight: 265 lb (120.2 kg)  Height: 6' (1.829 m)   Body mass index is 35.94 kg/m.    Assessment and Plan: Marland KitchenMarland KitchenRichard was seen today for fever.  Diagnoses and all orders for this visit:  COPD exacerbation ([place]) -     azithromycin (ZITHROMAX) 250 MG tablet; Take 2 tablets now and then one tablet for 4 days. -     predniSONE (DELTASONE) 50 MG tablet; One tab PO daily for 5 days.  Loose stools  Fever, unspecified fever cause  Fatigue, unspecified type   Patient needs to be COVID tested.  Discussed to use the drive by the [facility] site.  He should be out of work and stay at home until the test results return.  Discussed symptomatic treatment as well as prescription antibiotic and prednisone for potential COPD exacerbation.  Continue to use albuterol as needed.  Follow-up with any new or worsening symptoms.   Follow Up Instructions:    I discussed the assessment and treatment plan with the patient. The patient was provided an opportunity to ask questions and all were answered. The patient agreed with the plan and demonstrated an understanding of the instructions.   The patient was advised to call back or seek an in-person evaluation if the symptoms worsen or if the condition fails to improve as anticipated.  I provided 11 minutes of non-face-to-face time during this encounter.   Iran Planas, PA-C

## 2018-09-25 NOTE — Progress Notes (Signed)
Started Thursday:  Nausea Dizziness Achy Sweats Fever   No Covid exposure that is known. He is working, but Restaurant manager, fast food.

## 2018-09-26 LAB — NOVEL CORONAVIRUS, NAA: SARS-CoV-2, NAA: NOT DETECTED

## 2018-09-27 ENCOUNTER — Telehealth: Payer: Self-pay | Admitting: Neurology

## 2018-09-27 NOTE — Telephone Encounter (Signed)
Patient called wanting a letter to return to work tomorrow due to negative Covid test. Letter written. He will print from [name].   [place] - fyi.

## 2018-09-27 NOTE — Telephone Encounter (Signed)
Thanks

## 2018-10-18 ENCOUNTER — Telehealth: Payer: Self-pay | Admitting: Osteopathic Medicine

## 2018-10-18 ENCOUNTER — Other Ambulatory Visit: Payer: Self-pay

## 2018-10-18 MED ORDER — LISINOPRIL 10 MG PO TABS: ORAL_TABLET | ORAL | 0 refills | Status: DC

## 2018-10-18 NOTE — Telephone Encounter (Signed)
Pt called. He has to make an appointment before he can get refill on his Lisinopril. He is completely out . The only time he has off is this Friday and the only thing that you have available is an acute slot.

## 2018-10-18 NOTE — Telephone Encounter (Signed)
I have sent a refill for #15 to the local pharmacy. Pt can be scheduled on 10/23/18 at 850 am if that works for him. No further refills will be given without an for a f/u appt for bp check. Thanks.

## 2018-10-31 ENCOUNTER — Other Ambulatory Visit: Payer: Self-pay

## 2018-10-31 ENCOUNTER — Ambulatory Visit (INDEPENDENT_AMBULATORY_CARE_PROVIDER_SITE_OTHER): Payer: Self-pay | Admitting: Osteopathic Medicine

## 2018-10-31 ENCOUNTER — Encounter: Payer: Self-pay | Admitting: Osteopathic Medicine

## 2018-10-31 VITALS — BP 124/88 | HR 74 | Temp 98.0°F | Wt 274.1 lb

## 2018-10-31 DIAGNOSIS — E782 Mixed hyperlipidemia: Secondary | ICD-10-CM

## 2018-10-31 DIAGNOSIS — J449 Chronic obstructive pulmonary disease, unspecified: Secondary | ICD-10-CM

## 2018-10-31 DIAGNOSIS — F17200 Nicotine dependence, unspecified, uncomplicated: Secondary | ICD-10-CM

## 2018-10-31 DIAGNOSIS — I10 Essential (primary) hypertension: Secondary | ICD-10-CM

## 2018-10-31 DIAGNOSIS — Z23 Encounter for immunization: Secondary | ICD-10-CM

## 2018-10-31 MED ORDER — LISINOPRIL 10 MG PO TABS: ORAL_TABLET | ORAL | 3 refills | Status: DC

## 2018-10-31 MED ORDER — BUPROPION HCL ER (XL) 300 MG PO TB24: 300.0000 mg | ORAL_TABLET | Freq: Every day | ORAL | 3 refills | Status: DC

## 2018-10-31 NOTE — Progress Notes (Signed)
HPI: Eric Baird is a 58 y.o. male who  has a past medical history of Essential hypertension (11/03/2015), Stage 2 moderate COPD by GOLD classification ([place]) (01/14/2016), and Tobacco dependence (11/03/2015).  he presents to [facility] today, 10/31/18,  for chief complaint of:  HTN follow-up   BP a bit above goal today, pt reports increased stress caring for 60 year old. Otherwise feeling ok. Recovering from recent bronchitis. Pt is self-pay, requests samples of inhaler.   BP Readings from Last 3 Encounters:  10/31/18 124/88  03/16/18 100/62  02/17/18 127/82       At today's visit 10/31/18 ... PMH, PSH, FH reviewed and updated as needed.  Current medication list and allergy/intolerance hx reviewed and updated as needed. (See remainder of HPI, ROS, Phys Exam below)   No results found.  No results found for this or any previous visit (from the past 72 hour(s)).        ASSESSMENT/PLAN: The primary encounter diagnosis was Essential hypertension. Diagnoses of Need for influenza vaccination, Tobacco dependence, and Stage 2 moderate COPD by GOLD classification ([place]) were also pertinent to this visit.   Pt to monitor home BP   Reminder set to call him in 2 weeks re home BP and has he had labs yet?   Orders Placed This Encounter  Procedures  . Flu Vaccine QUAD 6+ mos PF IM (Fluarix Quad PF)  . COMPLETE METABOLIC PANEL WITH GFR  . Lipid panel     Meds ordered this encounter  Medications  . buPROPion (WELLBUTRIN XL) 300 MG 24 hr tablet    Sig: Take 1 tablet (300 mg total) by mouth daily.    Dispense:  90 tablet    Refill:  3  . DISCONTD: lisinopril (ZESTRIL) 10 MG tablet    Sig: TAKE 1 TABLET BY MOUTH ONCE DAILY . APPOINTMENT REQUIRED FOR FUTURE REFILLS    Dispense:  90 tablet    Refill:  3    NO REFILLS. PT is overdue for a f/u appt (virtual/in office) w/PCP.  [name] lisinopril (ZESTRIL) 10 MG tablet    Sig: TAKE 1 TABLET BY MOUTH ONCE  DAILY .    Dispense:  90 tablet    Refill:  3        Follow-up plan: Return for virtual visit / mychart message me in a month to check i nw/ blood pressures! .                                                 ################################################# ################################################# ################################################# #################################################    Current Meds  Medication Sig  . albuterol (PROVENTIL HFA;VENTOLIN HFA) 108 (90 Base) MCG/ACT inhaler Inhale 1-2 puffs into the lungs every 6 (six) hours as needed for wheezing or shortness of breath.  [name] buPROPion (WELLBUTRIN XL) 300 MG 24 hr tablet Take 1 tablet by mouth once daily  . ipratropium (ATROVENT) 0.06 % nasal spray Place 2 sprays into both nostrils 4 (four) times daily.  [name] lisinopril (ZESTRIL) 10 MG tablet TAKE 1 TABLET BY MOUTH ONCE DAILY . APPOINTMENT REQUIRED FOR FUTURE REFILLS  . predniSONE (DELTASONE) 50 MG tablet One tab PO daily for 5 days.    No Known Allergies     Review of Systems:  Constitutional: No recent illness  HEENT: No  headache, no vision change  Cardiac: No  chest pain, No  pressure, No palpitations  Respiratory:  No  shortness of breath. +chronic Cough  Musculoskeletal: No new myalgia/arthralgia  Skin: No  Rash  Neurologic: No  weakness, No  Dizziness  Psychiatric: No  concerns with depression, No  concerns with anxiety  Exam:  BP 136/90 (BP Location: Right Arm, Patient Position: Sitting, Cuff Size: Large)   Pulse 98   Temp 98 F (36.7 C) (Oral)   Wt 274 lb 1.3 oz (124.3 kg)   BMI 37.17 kg/m   Constitutional: VS see above. General Appearance: alert, well-developed, well-nourished, NAD  Eyes: Normal lids and conjunctive, non-icteric sclera  Ears, Nose, Mouth, Throat: MMM, Normal external inspection ears/nares/mouth/lips/gums.  Neck: No masses, trachea midline.    Respiratory: Normal respiratory effort. no wheeze, no rhonchi, no rales  Cardiovascular: S1/S2 normal, no murmur, no rub/gallop auscultated. RRR.   Musculoskeletal: Gait normal. Symmetric and independent movement of all extremities  Neurological: Normal balance/coordination. No tremor.  Skin: warm, dry, intact.   Psychiatric: Normal judgment/insight. Normal mood and affect. Oriented x3.       Visit summary with medication list and pertinent instructions was printed for patient to review, patient was advised to alert [place] if any updates are needed. All questions at time of visit were answered - patient instructed to contact office with any additional concerns. ER/RTC precautions were reviewed with the patient and understanding verbalized.   Note: Total time spent 15 minutes, greater than 50% of the visit was spent face-to-face counseling and coordinating care for the following: The primary encounter diagnosis was Essential hypertension. Diagnoses of Need for influenza vaccination, Tobacco dependence, and Stage 2 moderate COPD by GOLD classification (HCC) were also pertinent to this visit.[name]  Please note: voice recognition software was used to produce this document, and typos may escape review. Please contact Dr. Lyn Hollingshead for any needed clarifications.    Follow up plan: Return for virtual visit / mychart message me in a month to check i nw/ blood pressures! [name]

## 2018-11-04 LAB — COMPLETE METABOLIC PANEL WITH GFR
AG Ratio: 1.6 (calc) (ref 1.0–2.5)
ALT: 17 U/L (ref 9–46)
AST: 26 U/L (ref 10–35)
Albumin: 4 g/dL (ref 3.6–5.1)
Alkaline phosphatase (APISO): 68 U/L (ref 35–144)
BUN: 21 mg/dL (ref 7–25)
CO2: 26 mmol/L (ref 20–32)
Calcium: 9.6 mg/dL (ref 8.6–10.3)
Chloride: 106 mmol/L (ref 98–110)
Creat: 1.21 mg/dL (ref 0.70–1.25)
GFR, Est African American: 75 mL/min/{1.73_m2} (ref >=60)
GFR, Est Non African American: 65 mL/min/{1.73_m2} (ref >=60)
Globulin: 2.5 g/dL (calc) (ref 1.9–3.7)
Glucose, Bld: 89 mg/dL (ref 65–99)
Potassium: 4.9 mmol/L (ref 3.5–5.3)
Sodium: 141 mmol/L (ref 135–146)
Total Bilirubin: 0.3 mg/dL (ref 0.2–1.2)
Total Protein: 6.5 g/dL (ref 6.1–8.1)

## 2018-11-04 LAB — LIPID PANEL
Cholesterol: 237 mg/dL — ABNORMAL HIGH (ref <200)
HDL: 33 mg/dL — ABNORMAL LOW (ref >=40)
LDL Cholesterol (Calc): 162 mg/dL (calc) — ABNORMAL HIGH
Non-HDL Cholesterol (Calc): 204 mg/dL (calc) — ABNORMAL HIGH (ref <130)
Total CHOL/HDL Ratio: 7.2 (calc) — ABNORMAL HIGH (ref <5.0)
Triglycerides: 246 mg/dL — ABNORMAL HIGH (ref <150)

## 2018-11-06 DIAGNOSIS — E782 Mixed hyperlipidemia: Secondary | ICD-10-CM | POA: Insufficient documentation

## 2018-11-06 MED ORDER — ATORVASTATIN CALCIUM 40 MG PO TABS: 20.0000 mg | ORAL_TABLET | Freq: Every day | ORAL | 1 refills | Status: DC

## 2018-11-06 NOTE — Addendum Note (Signed)
Addended by: Maryla Morrow on: 11/06/2018 07:59 AM   Modules accepted: Orders

## 2018-11-14 ENCOUNTER — Telehealth: Payer: Self-pay | Admitting: Osteopathic Medicine

## 2018-11-14 NOTE — Telephone Encounter (Signed)
Eric Baird states he is caring for his sick child. He will send [place] the readings through MyChart or call us back.

## 2018-11-14 NOTE — Telephone Encounter (Signed)
-----   Message from Emeterio Reeve, DO sent at 10/31/2018  9:04 AM EDT ----- What are home BP looking like? Appt was 10/31/18. Also needs labs!

## 2018-11-14 NOTE — Telephone Encounter (Signed)
Eric Baird states his granddaughter tested positive for COVID-19. He reports no symptoms.   Blood pressure reading for today -   108/85 105/85 123/84 114/83  He states he has been taking care of his sick granddaughter and has been unable to check his blood pressure daily.

## 2018-11-15 NOTE — Telephone Encounter (Signed)
Understood, I hope she feels better soon.  The pressure is looking good, I am not worried about checking it every day but at least a couple of times per week just to keep an eye on things would be sufficient.

## 2018-11-16 NOTE — Telephone Encounter (Signed)
Patient advised of recommendations.  

## 2018-11-21 ENCOUNTER — Other Ambulatory Visit: Payer: Self-pay

## 2018-11-21 DIAGNOSIS — Z20822 Contact with and (suspected) exposure to covid-19: Secondary | ICD-10-CM

## 2018-11-22 LAB — NOVEL CORONAVIRUS, NAA: SARS-CoV-2, NAA: NOT DETECTED

## 2018-12-12 ENCOUNTER — Encounter: Payer: Self-pay | Admitting: Osteopathic Medicine

## 2018-12-12 ENCOUNTER — Telehealth (INDEPENDENT_AMBULATORY_CARE_PROVIDER_SITE_OTHER): Payer: Self-pay | Admitting: Osteopathic Medicine

## 2018-12-12 VITALS — BP 132/80 | HR 77 | Wt 274.0 lb

## 2018-12-12 DIAGNOSIS — I10 Essential (primary) hypertension: Secondary | ICD-10-CM

## 2018-12-12 DIAGNOSIS — E782 Mixed hyperlipidemia: Secondary | ICD-10-CM

## 2018-12-12 NOTE — Progress Notes (Signed)
Virtual Visit via Video (App used: MyChart) Note  I connected with      Eric Baird on 12/12/18 at 9:30 AM  by a telemedicine application and verified that I am speaking with the correct person using two identifiers.  Patient is at h ome I am in office   I discussed the limitations of evaluation and management by telemedicine and the availability of in person appointments. The patient expressed understanding and agreed to proceed.  History of Present Illness: Eric Baird is a 58 y.o. male who would like to discuss follow-up blood pressure   Doing well on current meds, BP today close to goal 130/80 or less, no CP/SOB.  Recovering from Covid, he caught this from his granddaughter, he has custody of her.  Both are recovering okay.        Observations/Objective: BP 132/80   Pulse 77   Wt 274 lb (124.3 kg)   BMI 37.16 kg/m  BP Readings from Last 3 Encounters:  12/12/18 132/80  10/31/18 124/88  03/16/18 100/62   Exam: Normal Speech.  NAD  Lab and Radiology Results No results found for this or any previous visit (from the past 72 hour(s)). No results found.     Assessment and Plan: 58 y.o. male with The primary encounter diagnosis was Essential hypertension. A diagnosis of Mixed hyperlipidemia was also pertinent to this visit.  Patient will be due to recheck labs for cholesterol/hepatic function to ensure medication safety.  We had started statin at last visit.  Patient was encouraged to get these labs done sometime within the next month or so, fasting.   Okay to continue current medications with regard to blood pressure  As long as he is doing well, no concerns on labs, and blood pressure continues to be at goal, can follow-up around Halloween next year when he will be due for routine annual.  PDMP not reviewed this encounter. No orders of the defined types were placed in this encounter.  No orders of the defined types were placed in this  encounter.  Patient Instructions  Please get blood work done sometime within the next month or two.  No appointment needed, but please do come in fasting, lab opens at 8 AM!  As long as labs okay and you are feeling well, and blood pressure continues to be at goal of around 130/80 or less, okay to hold off on follow-up with me until around Halloween of next year, when you will be due for annual checkup/labs!  Please let me know sooner than that if anything else comes up!     Instructions sent via MyChart. If MyChart not available, pt was given option for info via personal e-mail w/ no guarantee of protected health info over unsecured e-mail communication, and MyChart sign-up instructions were sent to patient.   Follow Up Instructions: Return in about 10 months (around 10/12/2019) for [place] (call week prior to visit for lab orders).    I discussed the assessment and treatment plan with the patient. The patient was provided an opportunity to ask questions and all were answered. The patient agreed with the plan and demonstrated an understanding of the instructions.   The patient was advised to call back or seek an in-person evaluation if any new concerns, if symptoms worsen or if the condition fails to improve as anticipated.  10 minutes of non-face-to-face time was provided during this encounter.      . . . . . . . . . . . . . [name]  Historical information moved to improve visibility of documentation.  Past Medical History:  Diagnosis Date  . Essential hypertension 11/03/2015  . Stage 2 moderate COPD by GOLD classification (HCC) 01/14/2016  . Tobacco dependence 11/03/2015   Past Surgical History:  Procedure Laterality Date  . KNEE SURGERY Right    Social History   Tobacco Use  . Smoking status: Current Every Day Smoker    Packs/day: 1.00    Types: Cigarettes  . Smokeless tobacco: Never Used  Substance Use Topics  . Alcohol use: No    Family history is unknown by patient.  Medications: Current Outpatient Medications  Medication Sig Dispense Refill  . albuterol (PROVENTIL HFA;VENTOLIN HFA) 108 (90 Base) MCG/ACT inhaler Inhale 1-2 puffs into the lungs every 6 (six) hours as needed for wheezing or shortness of breath. 1 Inhaler 6  . atorvastatin (LIPITOR) 40 MG tablet Take 0.5 tablets (20 mg total) by mouth daily. 90 tablet 1  . budesonide-formoterol (SYMBICORT) 160-4.5 MCG/ACT inhaler Inhale 2 puffs into the lungs 2 (two) times daily. 1 Inhaler 3  . buPROPion (WELLBUTRIN XL) 300 MG 24 hr tablet Take 1 tablet (300 mg total) by mouth daily. 90 tablet 3  . lisinopril (ZESTRIL) 10 MG tablet TAKE 1 TABLET BY MOUTH ONCE DAILY . 90 tablet 3   No current facility-administered medications for this visit.    No Known Allergies

## 2018-12-12 NOTE — Patient Instructions (Signed)
Please get blood work done sometime within the next month or two.  No appointment needed, but please do come in fasting, lab opens at 8 AM!  As long as labs okay and you are feeling well, and blood pressure continues to be at goal of around 130/80 or less, okay to hold off on follow-up with me until around Halloween of next year, when you will be due for annual checkup/labs!  Please let me know sooner than that if anything else comes up!

## 2019-06-04 ENCOUNTER — Encounter: Payer: Self-pay | Admitting: Emergency Medicine

## 2019-06-04 ENCOUNTER — Emergency Department
Admission: EM | Admit: 2019-06-04 | Discharge: 2019-06-04 | Disposition: A | Discharge status: Home / Self Care | Payer: Self-pay | Source: Home / Self Care | Attending: Family Medicine | Admitting: Family Medicine

## 2019-06-04 ENCOUNTER — Other Ambulatory Visit: Payer: Self-pay

## 2019-06-04 DIAGNOSIS — J069 Acute upper respiratory infection, unspecified: Secondary | ICD-10-CM

## 2019-06-04 DIAGNOSIS — J441 Chronic obstructive pulmonary disease with (acute) exacerbation: Secondary | ICD-10-CM

## 2019-06-04 MED ORDER — PREDNISONE 20 MG PO TABS: ORAL_TABLET | ORAL | 0 refills | Status: DC

## 2019-06-04 MED ORDER — DOXYCYCLINE HYCLATE 100 MG PO CAPS: 100.0000 mg | ORAL_CAPSULE | Freq: Two times a day (BID) | ORAL | 0 refills | Status: DC

## 2019-06-04 MED ORDER — GUAIFENESIN-CODEINE 100-10 MG/5ML PO SOLN: ORAL | 0 refills | Status: DC

## 2019-06-04 MED ORDER — ALBUTEROL SULFATE HFA 108 (90 BASE) MCG/ACT IN AERS: 1.0000 | INHALATION_SPRAY | Freq: Four times a day (QID) | RESPIRATORY_TRACT | 0 refills | Status: DC | PRN

## 2019-06-04 NOTE — Discharge Instructions (Addendum)
Resume Symbicort inhaler. Take plain guaifenesin (1200mg  extended release tabs such as Mucinex) twice daily, with plenty of water, for cough and congestion. Get adequate rest.   May use Afrin nasal spray (or generic oxymetazoline) each morning for about 5 days and then discontinue.  Also recommend using saline nasal spray several times daily and saline nasal irrigation (AYR is a common brand).  Use Flonase nasal spray each morning after using Afrin nasal spray and saline nasal irrigation. Try warm salt water gargles for sore throat.  Stop all antihistamines for now, and other non-prescription cough/cold preparations. May take Tylenol if needed for pain, fever, etc.  If symptoms become significantly worse during the night or over the weekend, proceed to the local emergency room.

## 2019-06-04 NOTE — ED Provider Notes (Signed)
[name]    CSN: [id number] Arrival date & time: 06/04/19  0855      History   Chief Complaint Chief Complaint  Patient presents with  . Chills  . Fever  . Diarrhea    HPI Eric Baird is a 59 y.o. male.   Five days ago patient developed increased sinus congestion, fatigue, and cough.  He has had a fever during the past several days, and complains of shortness of breath with activity.  He has COPD, but does not use his Symbicort inhaler regularly.  He had a J&J COVID19 vaccination 05/18/19.  The history is provided by the patient.    Past Medical History:  Diagnosis Date  . Essential hypertension 11/03/2015  . Stage 2 moderate COPD by GOLD classification (HCC) 01/14/2016  . Tobacco dependence 11/03/2015    Patient Active Problem List   Diagnosis Date Noted  . Mixed hyperlipidemia 11/06/2018  . Abnormal electrocardiogram (ECG) (EKG) 04/15/2017  . Fatigue 07/29/2016  . Generalized muscle ache 07/29/2016  . Stage 2 moderate COPD by GOLD classification (HCC) 01/14/2016  . Essential hypertension 11/03/2015  . Tobacco dependence 11/03/2015    Past Surgical History:  Procedure Laterality Date  . KNEE SURGERY Right        Home Medications    Prior to Admission medications   Medication Sig Start Date End Date Taking? Authorizing Provider  albuterol (VENTOLIN HFA) 108 (90 Base) MCG/ACT inhaler Inhale 1-2 puffs into the lungs every 6 (six) hours as needed for wheezing or shortness of breath. 06/04/19   Lattie Haw, MD  atorvastatin (LIPITOR) 40 MG tablet Take 0.5 tablets (20 mg total) by mouth daily. 11/06/18   Sunnie Nielsen, DO  budesonide-formoterol [facility]) 160-4.5 MCG/ACT inhaler Inhale 2 puffs into the lungs 2 (two) times daily. 03/16/18   Sunnie Nielsen, DO  buPROPion (WELLBUTRIN XL) 300 MG 24 hr tablet Take 1 tablet (300 mg total) by mouth daily. 10/31/18   Sunnie Nielsen, DO  doxycycline (VIBRAMYCIN) 100 MG capsule Take 1  capsule (100 mg total) by mouth 2 (two) times daily. Take with food. 06/04/19   Lattie Haw, MD  guaiFENesin-codeine 100-10 MG/5ML syrup Take 25mL by mouth at bedtime as needed for cough. 06/04/19   Lattie Haw, MD  lisinopril (ZESTRIL) 10 MG tablet TAKE 1 TABLET BY MOUTH ONCE DAILY . 10/31/18   Sunnie Nielsen, DO  predniSONE (DELTASONE) 20 MG tablet Take one tab by mouth twice daily for 4 days, then one daily for 3 days. Take with food. 06/04/19   Lattie Haw, MD    Family History Family History  Family history unknown: Yes    Social History Social History   Tobacco Use  . Smoking status: Current Every Day Smoker    Packs/day: 1.00    Types: Cigarettes  . Smokeless tobacco: Never Used  Substance Use Topics  . Alcohol use: No  . Drug use: No     Allergies   Patient has no known allergies.   Review of Systems Review of Systems No sore throat + cough No pleuritic pain + wheezing + nasal congestion + post-nasal drainage No sinus pain/pressure No itchy/red eyes No earache No hemoptysis + SOB + fever, + chills No nausea No vomiting No abdominal pain + diarrhea, resolved No urinary symptoms No skin rash + fatigue No myalgias No headache Used OTC meds (Mucinex) without relief   Physical Exam Triage Vital Signs ED Triage Vitals  Enc Vitals Group  BP 06/04/19 0919 117/80     Pulse Rate 06/04/19 0919 91     Resp 06/04/19 0919 18     Temp 06/04/19 0919 99 F (37.2 C)     Temp Source 06/04/19 0919 Oral     SpO2 06/04/19 0919 96 %     Weight 06/04/19 0920 268 lb (121.6 kg)     Height 06/04/19 0920 6' (1.829 m)     Head Circumference --      Peak Flow --      Pain Score 06/04/19 0919 0     Pain Loc --      Pain Edu? --      Excl. in [place]? --    No data found.  Updated Vital Signs BP 117/80 (BP Location: Right Arm)   Pulse 91   Temp 99 F (37.2 C) (Oral)   Resp 18   Ht 6' (1.829 m)   Wt 121.6 kg   SpO2 96%   BMI 36.35 kg/m    Visual Acuity Right Eye Distance:   Left Eye Distance:   Bilateral Distance:    Right Eye Near:   Left Eye Near:    Bilateral Near:     Physical Exam Nursing notes and Vital Signs reviewed. Appearance:  Patient appears stated age, and in no acute distress Eyes:  Pupils are equal, round, and reactive to light and accomodation.  Extraocular movement is intact.  Conjunctivae are not inflamed  Ears:  Canals normal.  Tympanic membranes normal.  Nose:  Mildly congested turbinates.  No sinus tenderness. Pharynx:  Normal Neck:  Supple.  Tenderness left upper lateral nodes. Lungs:  Diffuse bilateral wheezes/rhonchi.  Breath sounds are equal.  Moving air well. Heart:  Regular rate and rhythm without murmurs, rubs, or gallops.  Abdomen:  Nontender without masses or hepatosplenomegaly.  Bowel sounds are present.  No CVA or flank tenderness.  Extremities:  No edema.  Skin:  No rash present.   UC Treatments / Results  Labs (all labs ordered are listed, but only abnormal results are displayed) Labs Reviewed - No data to display  EKG   Radiology No results found.  Procedures Procedures (including critical care time)  Medications Ordered in UC Medications - No data to display  Initial Impression / Assessment and Plan / UC Course  I have reviewed the triage vital signs and the nursing notes.  Pertinent labs & imaging results that were available during my care of the patient were reviewed by me and considered in my medical decision making (see chart for details).    Begin empiric doxycycline and prednisone burst/taper. Rx for Robitussin AC for night time cough.  Controlled Substance Prescriptions I have consulted the [place] Controlled Substances Registry for this patient, and feel the risk/benefit ratio today is favorable for proceeding with this prescription for a controlled substance.   Refill albuterol inhaler. Followup with Family Doctor if not improved in one week.  Final  Clinical Impressions(s) / UC Diagnoses   Final diagnoses:  COPD with acute exacerbation ([place])  Viral URI with cough     Discharge Instructions     Resume Symbicort inhaler. Take plain guaifenesin (1200mg  extended release tabs such as Mucinex) twice daily, with plenty of water, for cough and congestion. Get adequate rest.   May use Afrin nasal spray (or generic oxymetazoline) each morning for about 5 days and then discontinue.  Also recommend using saline nasal spray several times daily and saline nasal irrigation (AYR is a common  brand).  Use Flonase nasal spray each morning after using Afrin nasal spray and saline nasal irrigation. Try warm salt water gargles for sore throat.  Stop all antihistamines for now, and other non-prescription cough/cold preparations. May take Tylenol if needed for pain, fever, etc.  If symptoms become significantly worse during the night or over the weekend, proceed to the local emergency room.      ED Prescriptions    Medication Sig Dispense Auth. Provider   albuterol (VENTOLIN HFA) 108 (90 Base) MCG/ACT inhaler Inhale 1-2 puffs into the lungs every 6 (six) hours as needed for wheezing or shortness of breath. 18 g Lattie Haw, MD   doxycycline (VIBRAMYCIN) 100 MG capsule Take 1 capsule (100 mg total) by mouth 2 (two) times daily. Take with food. 20 capsule Lattie Haw, MD   predniSONE (DELTASONE) 20 MG tablet Take one tab by mouth twice daily for 4 days, then one daily for 3 days. Take with food. 11 tablet Lattie Haw, MD   guaiFENesin-codeine 100-10 MG/5ML syrup Take 27mL by mouth at bedtime as needed for cough. 50 mL Lattie Haw, MD        Lattie Haw, MD 06/08/19 [phone]

## 2019-06-04 NOTE — ED Triage Notes (Signed)
Patient reports 4 days of diarrhea, chills and fever; last temp elevation yesterday; one diarrhea stool since midnight; taking Mucinex cold and flu with last dose 4 hours ago. Patient has COPD and states he feels dizzy when he coughs. Had J&J covid vaccination 05/18/19.

## 2019-10-31 ENCOUNTER — Other Ambulatory Visit: Payer: Self-pay | Admitting: Osteopathic Medicine

## 2019-10-31 DIAGNOSIS — F17200 Nicotine dependence, unspecified, uncomplicated: Secondary | ICD-10-CM

## 2019-12-07 ENCOUNTER — Telehealth (INDEPENDENT_AMBULATORY_CARE_PROVIDER_SITE_OTHER): Payer: Self-pay | Admitting: Nurse Practitioner

## 2019-12-07 ENCOUNTER — Encounter: Payer: Self-pay | Admitting: Nurse Practitioner

## 2019-12-07 DIAGNOSIS — J441 Chronic obstructive pulmonary disease with (acute) exacerbation: Secondary | ICD-10-CM

## 2019-12-07 MED ORDER — PREDNISONE 20 MG PO TABS: ORAL_TABLET | ORAL | 0 refills | Status: DC

## 2019-12-07 MED ORDER — DOXYCYCLINE HYCLATE 100 MG PO TABS: 100.0000 mg | ORAL_TABLET | Freq: Two times a day (BID) | ORAL | 0 refills | Status: DC

## 2019-12-07 MED ORDER — GUAIFENESIN-CODEINE 100-10 MG/5ML PO SOLN: 5.0000 mL | Freq: Every evening | ORAL | 0 refills | Status: DC | PRN

## 2019-12-07 NOTE — Progress Notes (Signed)
Virtual Visit via Telephone Note  I connected with  Eric Baird on 12/07/19 at  2:30 PM EST by telephone and verified that I am speaking with the correct person using two identifiers.   I discussed the limitations, risks, security and privacy concerns of performing an evaluation and management service by telephone and the availability of in person appointments. I also discussed with the patient that there may be a patient responsible charge related to this service. The patient expressed understanding and agreed to proceed.  Participating parties included in this telephone visit include:  The patient is: at home I am: in the office  Subjective:    CC: COPD exacerbation  HPI: Eric Baird is a 59 year old male with a history of COPD that presents today with concerns for acute exacerbation that started "a few" days ago. He reports that his daughter was ill with upper respiratory symptoms and he subsequently became ill. She is COVID negative. He has been COVID vaccinated.   He endorses hx COPD with exacerbations in the fall when the weather changes consistent to his symptoms today, congestion, cough, increased mucous production, body aches, wheezing, sinus pressure, and shortness of breath.   He denies fever, loss of taste, loss of smell, chills, sore throat, ear pain, dizziness, chest pain, or palpitations.   He has been using his inhalers regularly. He has not been taking any other OTC medications for symptoms.   Past medical history, Surgical history, Family history not pertinant except as noted below, Social history, Allergies, and medications have been entered into the medical record, reviewed, and corrections made.   Review of Systems:  All review of systems negative except what is listed in the HPI  Objective:    General: Speaking clearly in complete sentences without any shortness of breath.  He is audible congested and wheezing can be auscultated easily.  Alert and oriented x3.    Normal judgment.  No apparent acute distress.  Impression and Recommendations:   1. COPD with acute exacerbation (HCC) Symptoms and presentation consistent with COPD exacerbation. Given patients history, will begin treatment with oral doxycycline and a low dose prednisone taper for a total of 7 days.  Continue albuterol and Symbicort inhalers. Cough medication provided for night time cough to help with sleep.  Discussed warning signs that would warrant immediate evaluation in the emergency department.  If symptoms persist, he may benefit from COVID testing.  Recommend follow-up if symptoms worsen or fail to improve.  - doxycycline (VIBRA-TABS) 100 MG tablet; Take 1 tablet (100 mg total) by mouth 2 (two) times daily.  Dispense: 14 tablet; Refill: 0 - predniSONE (DELTASONE) 20 MG tablet; Take 2 tabs by mouth for 3 days then one tab by mouth for 4 days.  Dispense: 10 tablet; Refill: 0 - guaiFENesin-codeine 100-10 MG/5ML syrup; Take 5 mLs by mouth at bedtime as needed for cough.  Dispense: 50 mL; Refill: 0    I discussed the assessment and treatment plan with the patient. The patient was provided an opportunity to ask questions and all were answered. The patient agreed with the plan and demonstrated an understanding of the instructions.   The patient was advised to call back or seek an in-person evaluation if the symptoms worsen or if the condition fails to improve as anticipated.  I provided 20 minutes of non-face-to-face time during this TELEPHONE encounter.    Tollie Eth, NP

## 2020-01-28 ENCOUNTER — Other Ambulatory Visit: Payer: Self-pay | Admitting: Nurse Practitioner

## 2020-01-28 DIAGNOSIS — I10 Essential (primary) hypertension: Secondary | ICD-10-CM

## 2020-01-28 DIAGNOSIS — F17200 Nicotine dependence, unspecified, uncomplicated: Secondary | ICD-10-CM

## 2020-01-28 DIAGNOSIS — E782 Mixed hyperlipidemia: Secondary | ICD-10-CM

## 2020-01-28 MED ORDER — ATORVASTATIN CALCIUM 40 MG PO TABS: ORAL_TABLET | ORAL | 2 refills | Status: DC

## 2020-01-28 MED ORDER — LISINOPRIL 10 MG PO TABS: ORAL_TABLET | ORAL | 2 refills | Status: DC

## 2020-01-28 MED ORDER — BUPROPION HCL ER (XL) 300 MG PO TB24: 300.0000 mg | ORAL_TABLET | Freq: Every day | ORAL | 2 refills | Status: DC

## 2020-01-31 ENCOUNTER — Ambulatory Visit (INDEPENDENT_AMBULATORY_CARE_PROVIDER_SITE_OTHER): Payer: Self-pay | Admitting: Nurse Practitioner

## 2020-01-31 ENCOUNTER — Other Ambulatory Visit: Payer: Self-pay

## 2020-01-31 ENCOUNTER — Encounter: Payer: Self-pay | Admitting: Nurse Practitioner

## 2020-01-31 VITALS — BP 132/75 | HR 92 | Wt 294.0 lb

## 2020-01-31 DIAGNOSIS — Z6839 Body mass index (BMI) 39.0-39.9, adult: Secondary | ICD-10-CM | POA: Insufficient documentation

## 2020-01-31 DIAGNOSIS — J449 Chronic obstructive pulmonary disease, unspecified: Secondary | ICD-10-CM

## 2020-01-31 DIAGNOSIS — Z716 Tobacco abuse counseling: Secondary | ICD-10-CM | POA: Insufficient documentation

## 2020-01-31 DIAGNOSIS — F17200 Nicotine dependence, unspecified, uncomplicated: Secondary | ICD-10-CM

## 2020-01-31 DIAGNOSIS — I10 Essential (primary) hypertension: Secondary | ICD-10-CM

## 2020-01-31 DIAGNOSIS — E782 Mixed hyperlipidemia: Secondary | ICD-10-CM

## 2020-01-31 DIAGNOSIS — F17209 Nicotine dependence, unspecified, with unspecified nicotine-induced disorders: Secondary | ICD-10-CM

## 2020-01-31 NOTE — Progress Notes (Signed)
Pt in Rm -1  pt stated he is here for a check up

## 2020-01-31 NOTE — Progress Notes (Signed)
Established Patient Office Visit  Subjective:  Patient ID: Eric Baird, male    DOB: Jul 06, 1960  Age: 60 y.o. MRN: [id number]  CC:  Chief Complaint  Patient presents with  . Medication Refill    Management of HTN, HLD    HPI [name] presents for follow-up for chronic condition management. He was last seen by his PCP in October 2020. He is due for labs today. Refills sent earlier this week in preparation for this visit.   HYPERTENSION Current Treatments: lisinopril 10 mg once a day Hypertension status: stable  Satisfied with current treatment? yes Duration of hypertension: chronic BP monitoring frequency:  rarely BP range: cannot remember- says they are "good" BP medication side effects:  no Medication compliance: excellent compliance Aspirin: yes Recurrent headaches: does have headaches two to three times a week- no vision changes or weakness Visual changes: yes Palpitations: no Dyspnea: yes- COPD Chest pain: no Lower extremity edema: at the end of the day, improves with rest Dizzy/lightheaded: no  HYPERLIPIDEMIA Current Treatments:atorvastatin 10mg  daily Hyperlipidemia status: excellent compliance Satisfied with current treatment?  yes Side effects:  no Medication compliance: excellent compliance Supplements: none Aspirin:  yes The 10-year ASCVD risk score DC Jr., et al., 2013) is: 26.1%   Values used to calculate the score:     Age: 55 years     Sex: Male     Is Non-Hispanic African American: No     Diabetic: No     Tobacco smoker: Yes     Systolic Blood Pressure: 132 mmHg     Is BP treated: Yes     HDL Cholesterol: 33 mg/dL     Total Cholesterol: 237 mg/dL Chest pain:  no Coronary artery disease:  no Family history CAD:  no Family history Jaquan Sadowsky CAD:  no  COPD COPD status: controlled Current treatments: Symbicort and albuterol Satisfied with current treatment?: yes Oxygen use: no Dyspnea frequency: no Cough frequency: no Rescue inhaler  frequency: maybe once a day Limitation of activity: yes- when walking around alot Productive cough: no Pneumovax: not completed Influenza: unknown COVID-19: Up to Date  SMOKING CESSATION Current Treatments: wellbutrin Smoking Status: current smoker Smoking Amount: 1 ppd Smoking Onset: 50 + years Smoking Quit Date: No planned date Smoking triggers: driving, waking, after meals Type of tobacco use: cigarette  Children in the house: no Other household members who smoke: no Treatments attempted: wellbutrin,  Pneumovax: no       Past Medical History:  Diagnosis Date  . Essential hypertension 11/03/2015  . Stage 2 moderate COPD by GOLD classification (HCC) 01/14/2016  . Tobacco dependence 11/03/2015    Past Surgical History:  Procedure Laterality Date  . KNEE SURGERY Right     Family History  Family history unknown: Yes    Social History   Socioeconomic History  . Marital status: Single    Spouse name: Not on file  . Number of children: Not on file  . Years of education: Not on file  . Highest education level: Not on file  Occupational History  . Not on file  Tobacco Use  . Smoking status: Current Every Day Smoker    Packs/day: 1.00    Types: Cigarettes  . Smokeless tobacco: Never Used  Substance and Sexual Activity  . Alcohol use: No  . Drug use: No  . Sexual activity: Not on file  Other Topics Concern  . Not on file  Social History Narrative  . Not on file   Social Determinants  of Health   Financial Resource Strain: Not on file  Food Insecurity: Not on file  Transportation Needs: Not on file  Physical Activity: Not on file  Stress: Not on file  Social Connections: Not on file  Intimate Partner Violence: Not on file    Outpatient Medications Prior to Visit  Medication Sig Dispense Refill  . albuterol (VENTOLIN HFA) 108 (90 Base) MCG/ACT inhaler Inhale 1-2 puffs into the lungs every 6 (six) hours as needed for wheezing or shortness of breath. 18 g  0  . atorvastatin (LIPITOR) 40 MG tablet Take 1/2 (one-half) tablet by mouth once daily 90 tablet 2  . budesonide-formoterol (SYMBICORT) 160-4.5 MCG/ACT inhaler Inhale 2 puffs into the lungs 2 (two) times daily. 1 Inhaler 3  . buPROPion (WELLBUTRIN XL) 300 MG 24 hr tablet Take 1 tablet (300 mg total) by mouth daily. 90 tablet 2  . lisinopril (ZESTRIL) 10 MG tablet TAKE 1 TABLET BY MOUTH ONCE DAILY 90 tablet 2   No facility-administered medications prior to visit.    No Known Allergies  ROS Review of Systems All review of systems negative except what is listed in the HPI    Objective:    Physical Exam Vitals and nursing note reviewed.  Constitutional:      Appearance: He is obese.  HENT:     Head: Normocephalic and atraumatic.     Right Ear: Tympanic membrane, ear canal and external ear normal.     Left Ear: Tympanic membrane, ear canal and external ear normal.  Eyes:     Extraocular Movements: Extraocular movements intact.     Conjunctiva/sclera: Conjunctivae normal.     Pupils: Pupils are equal, round, and reactive to light.  Neck:     Vascular: No carotid bruit.  Cardiovascular:     Rate and Rhythm: Normal rate and regular rhythm.     Pulses: Normal pulses.     Heart sounds: Normal heart sounds.  Pulmonary:     Effort: Pulmonary effort is normal.     Breath sounds: Normal breath sounds.  Abdominal:     General: Bowel sounds are normal.     Palpations: Abdomen is soft.  Musculoskeletal:        General: Normal range of motion.     Cervical back: Normal range of motion and neck supple.     Right lower leg: No edema.     Left lower leg: No edema.  Lymphadenopathy:     Cervical: No cervical adenopathy.  Skin:    General: Skin is warm and dry.     Capillary Refill: Capillary refill takes less than 2 seconds.  Neurological:     General: No focal deficit present.     Mental Status: He is alert and oriented to person, place, and time.  Psychiatric:        Mood and  Affect: Mood normal.        Behavior: Behavior normal.        Thought Content: Thought content normal.        Judgment: Judgment normal.     BP 132/75   Pulse 92   Wt 294 lb (133.4 kg)   SpO2 97%   BMI 39.87 kg/m  Wt Readings from Last 3 Encounters:  01/31/20 294 lb (133.4 kg)  06/04/19 268 lb (121.6 kg)  12/12/18 274 lb (124.3 kg)     There are no preventive care reminders to display for this patient.  There are no preventive care reminders to display  for this patient.  Lab Results  Component Value Date   TSH 1.01 04/11/2017   Lab Results  Component Value Date   WBC 9.7 04/11/2017   HGB 15.9 04/11/2017   HCT 46.7 04/11/2017   MCV 91.4 04/11/2017   PLT 308 04/11/2017   Lab Results  Component Value Date   NA 141 11/03/2018   K 4.9 11/03/2018   CO2 26 11/03/2018   GLUCOSE 89 11/03/2018   BUN 21 11/03/2018   CREATININE 1.21 11/03/2018   BILITOT 0.3 11/03/2018   ALKPHOS 61 07/29/2016   AST 26 11/03/2018   ALT 17 11/03/2018   PROT 6.5 11/03/2018   ALBUMIN 4.1 07/29/2016   CALCIUM 9.6 11/03/2018   Lab Results  Component Value Date   CHOL 237 (H) 11/03/2018   Lab Results  Component Value Date   HDL 33 (L) 11/03/2018   Lab Results  Component Value Date   LDLCALC 162 (H) 11/03/2018   Lab Results  Component Value Date   TRIG 246 (H) 11/03/2018   Lab Results  Component Value Date   CHOLHDL 7.2 (H) 11/03/2018   No results found for: HGBA1C    Assessment & Plan:   Problem List Items Addressed This Visit      Cardiovascular and Mediastinum   Essential hypertension - Primary    Refills provided.  Will obtain labs today.  He has gained some weight and is still smoking- discussed cessation, diet, and exercise recommendations.  BP pretty good today. Recommend at home monitoring and notify office if results greater than 140 systolic.  Follow-up in 1 year with PCP or sooner if needed.       Relevant Orders   Lipid panel   CBC   COMPLETE METABOLIC  PANEL WITH GFR   HgB A1c     Respiratory   Stage 2 moderate COPD by GOLD classification (HCC)   Relevant Orders   CBC     Other   Mixed hyperlipidemia   Relevant Orders   Lipid panel   CBC   COMPLETE METABOLIC PANEL WITH GFR   HgB T9H   Tobacco use disorder, continuous   Body mass index (BMI) of 39.0-39.9 in adult   Tobacco abuse counseling      No orders of the defined types were placed in this encounter.   Follow-up: Return in about 1 year (around 01/30/2021).    Tollie Eth, NP

## 2020-01-31 NOTE — Assessment & Plan Note (Addendum)
BMI 39.87 today. Patient has reported an increase in weight since cutting back on smoking.  Recommend daily exercise starting with 10 minutes of walking every day and decrease caloric intake by substituting one soda/sugary drink with water then increasing slowly.  Discussed with patient that there are medications that may be helpful for weight loss if he is not successful with diet and exercise.  Discussed use of smoking cessation hotline for tips on ways to help stop smoking and avoid additional weight gain.  Information provided on AVS

## 2020-01-31 NOTE — Assessment & Plan Note (Addendum)
Discussed smoking cessation and decrease in the amount smoked.  Recommendations for increasing length between cigarettes discussed as well as resources that may be helpful. He has cut back to 1 ppd from 2 ppd, so this is encouraging. Praised patient for his success thus far and encouraged continued work towards complete elimination of cigarettes.  Continue Wellbutrin, refills provided.  Follow-up in 1 year with PCP or sooner if needed

## 2020-01-31 NOTE — Assessment & Plan Note (Signed)
Patient still taking Wellbutrin. He has reduced to 1ppd, but is concerned with weight gain.  Recommendations for weight, diet, exercise, and smoking cessation provided.  Refills on Wellbutrin.  Encouraged smoking cessation due to high ASCVD risks.  Follow-up in 1 year or sooner if he would like to try different medications or has any complications.

## 2020-01-31 NOTE — Patient Instructions (Addendum)
Call 1800-QUIT-NOW for help with stopping smoking.  There are prescription medications that my be helpful to quit smoking. Please be sure to ask me if you are interested in learning more about these options.    Managing the Challenge of Quitting Smoking Quitting smoking is a physical and mental challenge. You will face cravings, withdrawal symptoms, and temptation. Before quitting, work with your health care provider to make a plan that can help you manage quitting. Preparation can help you quit and keep you from giving in. How to manage lifestyle changes Managing stress Stress can make you want to smoke, and wanting to smoke may cause stress. It is important to find ways to manage your stress. You might try some of the following:  Practice relaxation techniques. ? Breathe slowly and deeply, in through your nose and out through your mouth. ? Listen to music. ? Soak in a bath or take a shower. ? Imagine a peaceful place or vacation.  Get some support. ? Talk with family or friends about your stress. ? Join a support group. ? Talk with a counselor or therapist.  Get some physical activity. ? Go for a walk, run, or bike ride. ? Play a favorite sport. ? Practice yoga.   Medicines Talk with your health care provider about medicines that might help you deal with cravings and make quitting easier for you. Relationships Social situations can be difficult when you are quitting smoking. To manage this, you can:  Avoid parties and other social situations where people might be smoking.  Avoid alcohol.  Leave right away if you have the urge to smoke.  Explain to your family and friends that you are quitting smoking. Ask for support and let them know you might be a bit grumpy.  Plan activities where smoking is not an option. General instructions Be aware that many people gain weight after they quit smoking. However, not everyone does. To keep from gaining weight, have a plan in place  before you quit and stick to the plan after you quit. Your plan should include:  Having healthy snacks. When you have a craving, it may help to: ? Eat popcorn, carrots, celery, or other cut vegetables. ? Chew sugar-free gum.  Changing how you eat. ? Eat small portion sizes at meals. ? Eat 4-6 small meals throughout the day instead of 1-2 large meals a day. ? Be mindful when you eat. Do not watch television or do other things that might distract you as you eat.  Exercising regularly. ? Make time to exercise each day. If you do not have time for a long workout, do short bouts of exercise for 5-10 minutes several times a day. ? Do some form of strengthening exercise, such as weight lifting. ? Do some exercise that gets your heart beating and causes you to breathe deeply, such as walking fast, running, swimming, or biking. This is very important.  Drinking plenty of water or other low-calorie or no-calorie drinks. Drink 6-8 glasses of water daily.   How to recognize withdrawal symptoms Your body and mind may experience discomfort as you try to get used to not having nicotine in your system. These effects are called withdrawal symptoms. They may include:  Feeling hungrier than normal.  Having trouble concentrating.  Feeling irritable or restless.  Having trouble sleeping.  Feeling depressed.  Craving a cigarette. To manage withdrawal symptoms:  Avoid places, people, and activities that trigger your cravings.  Remember why you want to quit.  Get  plenty of sleep.  Avoid coffee and other caffeinated drinks. These may worsen some of your symptoms. These symptoms may surprise you. But be assured that they are normal to have when quitting smoking. How to manage cravings Come up with a plan for how to deal with your cravings. The plan should include the following:  A definition of the specific situation you want to deal with.  An alternative action you will take.  A clear idea for  how this action will help.  The name of someone who might help you with this. Cravings usually last for 5-10 minutes. Consider taking the following actions to help you with your plan to deal with cravings:  Keep your mouth busy. ? Chew sugar-free gum. ? Suck on hard candies or a straw. ? Brush your teeth.  Keep your hands and body busy. ? Change to a different activity right away. ? Squeeze or play with a ball. ? Do an activity or a hobby, such as making bead jewelry, practicing needlepoint, or working with wood. ? Mix up your normal routine. ? Take a short exercise break. Go for a quick walk or run up and down stairs.  Focus on doing something kind or helpful for someone else.  Call a friend or family member to talk during a craving.  Join a support group.  Contact a quitline. Where to find support To get help or find a support group:  Call the National Cancer Institute's Smoking Quitline: 1-800-QUIT NOW 424-455-6837((339) 405-4968)  Visit the website of the Substance Abuse and Mental Health Services Administration: SkateOasis.com.ptwww.samhsa.gov  Text QUIT to SmokefreeTXT: 562130: 478848 Where to find more information Visit these websites to find more information on quitting smoking:  National Cancer Institute: www.smokefree.gov  American Lung Association: www.lung.org  American Cancer Society: www.cancer.org  Centers for Disease Control and Prevention: FootballExhibition.com.brwww.cdc.gov  American Heart Association: www.heart.org Contact a health care provider if:  You want to change your plan for quitting.  The medicines you are taking are not helping.  Your eating feels out of control or you cannot sleep. Get help right away if:  You feel depressed or become very anxious. Summary  Quitting smoking is a physical and mental challenge. You will face cravings, withdrawal symptoms, and temptation to smoke again. Preparation can help you as you go through these challenges.  Try different techniques to manage stress, handle  social situations, and prevent weight gain.  You can deal with cravings by keeping your mouth busy (such as by chewing gum), keeping your hands and body busy, calling family or friends, or contacting a quitline for people who want to quit smoking.  You can deal with withdrawal symptoms by avoiding places where people smoke, getting plenty of rest, and avoiding drinks with caffeine. This information is not intended to replace advice given to you by your health care provider. Make sure you discuss any questions you have with your health care provider. Document Revised: 10/10/2018 Document Reviewed: 10/10/2018 Elsevier Patient Education  2021 Elsevier Inc.   DASH Eating Plan DASH stands for Dietary Approaches to Stop Hypertension. The DASH eating plan is a healthy eating plan that has been shown to:  Reduce high blood pressure (hypertension).  Reduce your risk for type 2 diabetes, heart disease, and stroke.  Help with weight loss. What are tips for following this plan? Reading food labels  Check food labels for the amount of salt (sodium) per serving. Choose foods with less than 5 percent of the Daily Value of sodium.  Generally, foods with less than 300 milligrams (mg) of sodium per serving fit into this eating plan.  To find whole grains, look for the word "whole" as the first word in the ingredient list. Shopping  Buy products labeled as "low-sodium" or "no salt added."  Buy fresh foods. Avoid canned foods and pre-made or frozen meals. Cooking  Avoid adding salt when cooking. Use salt-free seasonings or herbs instead of table salt or sea salt. Check with your health care provider or pharmacist before using salt substitutes.  Do not fry foods. Cook foods using healthy methods such as baking, boiling, grilling, roasting, and broiling instead.  Cook with heart-healthy oils, such as olive, canola, avocado, soybean, or sunflower oil. Meal planning  Eat a balanced diet that  includes: ? 4 or more servings of fruits and 4 or more servings of vegetables each day. Try to fill one-half of your plate with fruits and vegetables. ? 6-8 servings of whole grains each day. ? Less than 6 oz (170 g) of lean meat, poultry, or fish each day. A 3-oz (85-g) serving of meat is about the same size as a deck of cards. One egg equals 1 oz (28 g). ? 2-3 servings of low-fat dairy each day. One serving is 1 cup (237 mL). ? 1 serving of nuts, seeds, or beans 5 times each week. ? 2-3 servings of heart-healthy fats. Healthy fats called omega-3 fatty acids are found in foods such as walnuts, flaxseeds, fortified milks, and eggs. These fats are also found in cold-water fish, such as sardines, salmon, and mackerel.  Limit how much you eat of: ? Canned or prepackaged foods. ? Food that is high in trans fat, such as some fried foods. ? Food that is high in saturated fat, such as fatty meat. ? Desserts and other sweets, sugary drinks, and other foods with added sugar. ? Full-fat dairy products.  Do not salt foods before eating.  Do not eat more than 4 egg yolks a week.  Try to eat at least 2 vegetarian meals a week.  Eat more home-cooked food and less restaurant, buffet, and fast food.   Lifestyle  When eating at a restaurant, ask that your food be prepared with less salt or no salt, if possible.  If you drink alcohol: ? Limit how much you use to:  0-1 drink a day for women who are not pregnant.  0-2 drinks a day for men. ? Be aware of how much alcohol is in your drink. In the [place] one drink equals one 12 oz bottle of beer (355 mL), one 5 oz glass of wine (148 mL), or one 1 oz glass of hard liquor (44 mL). General information  Avoid eating more than 2,300 mg of salt a day. If you have hypertension, you may need to reduce your sodium intake to 1,500 mg a day.  Work with your health care provider to maintain a healthy body weight or to lose weight. Ask what an ideal weight is for  you.  Get at least 30 minutes of exercise that causes your heart to beat faster (aerobic exercise) most days of the week. Activities may include walking, swimming, or biking.  Work with your health care provider or dietitian to adjust your eating plan to your individual calorie needs. What foods should I eat? Fruits All fresh, dried, or frozen fruit. Canned fruit in natural juice (without added sugar). Vegetables Fresh or frozen vegetables (raw, steamed, roasted, or grilled). Low-sodium or reduced-sodium tomato and  vegetable juice. Low-sodium or reduced-sodium tomato sauce and tomato paste. Low-sodium or reduced-sodium canned vegetables. Grains Whole-grain or whole-wheat bread. Whole-grain or whole-wheat pasta. Brown rice. Orpah Cobb. Bulgur. Whole-grain and low-sodium cereals. Pita bread. Low-fat, low-sodium crackers. Whole-wheat flour tortillas. Meats and other proteins Skinless chicken or [place]. Ground chicken or [place]. Pork with fat trimmed off. Fish and seafood. Egg whites. Dried beans, peas, or lentils. Unsalted nuts, nut butters, and seeds. Unsalted canned beans. Lean cuts of beef with fat trimmed off. Low-sodium, lean precooked or cured meat, such as sausages or meat loaves. Dairy Low-fat (1%) or fat-free (skim) milk. Reduced-fat, low-fat, or fat-free cheeses. Nonfat, low-sodium ricotta or cottage cheese. Low-fat or nonfat yogurt. Low-fat, low-sodium cheese. Fats and oils Soft margarine without trans fats. Vegetable oil. Reduced-fat, low-fat, or light mayonnaise and salad dressings (reduced-sodium). Canola, safflower, olive, avocado, soybean, and sunflower oils. Avocado. Seasonings and condiments Herbs. Spices. Seasoning mixes without salt. Other foods Unsalted popcorn and pretzels. Fat-free sweets. The items listed above may not be a complete list of foods and beverages you can eat. Contact a dietitian for more information. What foods should I avoid? Fruits Canned fruit in a  light or heavy syrup. Fried fruit. Fruit in cream or butter sauce. Vegetables Creamed or fried vegetables. Vegetables in a cheese sauce. Regular canned vegetables (not low-sodium or reduced-sodium). Regular canned tomato sauce and paste (not low-sodium or reduced-sodium). Regular tomato and vegetable juice (not low-sodium or reduced-sodium). Rosita Fire. Olives. Grains Baked goods made with fat, such as croissants, muffins, or some breads. Dry pasta or rice meal packs. Meats and other proteins Fatty cuts of meat. Ribs. Fried meat. Tomasa Blase. Bologna, salami, and other precooked or cured meats, such as sausages or meat loaves. Fat from the back of a pig (fatback). Bratwurst. Salted nuts and seeds. Canned beans with added salt. Canned or smoked fish. Whole eggs or egg yolks. Chicken or [place] with skin. Dairy Whole or 2% milk, cream, and half-and-half. Whole or full-fat cream cheese. Whole-fat or sweetened yogurt. Full-fat cheese. Nondairy creamers. Whipped toppings. Processed cheese and cheese spreads. Fats and oils Butter. Stick margarine. Lard. Shortening. Ghee. Bacon fat. Tropical oils, such as coconut, palm kernel, or palm oil. Seasonings and condiments Onion salt, garlic salt, seasoned salt, table salt, and sea salt. Worcestershire sauce. Tartar sauce. Barbecue sauce. Teriyaki sauce. Soy sauce, including reduced-sodium. Steak sauce. Canned and packaged gravies. Fish sauce. Oyster sauce. Cocktail sauce. Store-bought horseradish. Ketchup. Mustard. Meat flavorings and tenderizers. Bouillon cubes. Hot sauces. Pre-made or packaged marinades. Pre-made or packaged taco seasonings. Relishes. Regular salad dressings. Other foods Salted popcorn and pretzels. The items listed above may not be a complete list of foods and beverages you should avoid. Contact a dietitian for more information. Where to find more information  National Heart, Lung, and Blood Institute: PopSteam.is  American Heart Association:  www.heart.org  Academy of Nutrition and Dietetics: www.eatright.org  National Kidney Foundation: www.kidney.org Summary  The DASH eating plan is a healthy eating plan that has been shown to reduce high blood pressure (hypertension). It may also reduce your risk for type 2 diabetes, heart disease, and stroke.  When on the DASH eating plan, aim to eat more fresh fruits and vegetables, whole grains, lean proteins, low-fat dairy, and heart-healthy fats.  With the DASH eating plan, you should limit salt (sodium) intake to 2,300 mg a day. If you have hypertension, you may need to reduce your sodium intake to 1,500 mg a day.  Work with your health care provider  or dietitian to adjust your eating plan to your individual calorie needs. This information is not intended to replace advice given to you by your health care provider. Make sure you discuss any questions you have with your health care provider. Document Revised: 11/24/2018 Document Reviewed: 11/24/2018 Elsevier Patient Education  2021 [employer].

## 2020-01-31 NOTE — Assessment & Plan Note (Addendum)
Will obtain labs today.  Refills provided.  Discussed diet and exercise recommendations.  ASCVD risk is high- need to focus on weight loss, activity, and smoking cessation to lower these risks.  Follow-up with PCP in 1 year or sooner if needed.

## 2020-01-31 NOTE — Assessment & Plan Note (Signed)
Refills provided.  Will obtain labs today.  He has gained some weight and is still smoking- discussed cessation, diet, and exercise recommendations.  BP pretty good today. Recommend at home monitoring and notify office if results greater than 140 systolic.  Follow-up in 1 year with PCP or sooner if needed.

## 2020-01-31 NOTE — Assessment & Plan Note (Signed)
Well controlled at this time.  Continue medication as prescribed.  Follow-up in 1 year with PCP or sooner if needed.

## 2020-02-02 LAB — COMPLETE METABOLIC PANEL WITH GFR
AG Ratio: 1.7 (calc) (ref 1.0–2.5)
ALT: 24 U/L (ref 9–46)
AST: 37 U/L — ABNORMAL HIGH (ref 10–35)
Albumin: 4.1 g/dL (ref 3.6–5.1)
Alkaline phosphatase (APISO): 61 U/L (ref 35–144)
BUN: 18 mg/dL (ref 7–25)
CO2: 26 mmol/L (ref 20–32)
Calcium: 9.7 mg/dL (ref 8.6–10.3)
Chloride: 105 mmol/L (ref 98–110)
Creat: 1.12 mg/dL (ref 0.70–1.25)
GFR, Est African American: 82 mL/min/{1.73_m2} (ref >=60)
GFR, Est Non African American: 71 mL/min/{1.73_m2} (ref >=60)
Globulin: 2.4 g/dL (calc) (ref 1.9–3.7)
Glucose, Bld: 89 mg/dL (ref 65–99)
Potassium: 4.6 mmol/L (ref 3.5–5.3)
Sodium: 138 mmol/L (ref 135–146)
Total Bilirubin: 0.5 mg/dL (ref 0.2–1.2)
Total Protein: 6.5 g/dL (ref 6.1–8.1)

## 2020-02-02 LAB — CBC
HCT: 45.4 % (ref 38.5–50.0)
Hemoglobin: 15.6 g/dL (ref 13.2–17.1)
MCH: 32.4 pg (ref 27.0–33.0)
MCHC: 34.4 g/dL (ref 32.0–36.0)
MCV: 94.2 fL (ref 80.0–100.0)
MPV: 11.5 fL (ref 7.5–12.5)
Platelets: 281 10*3/uL (ref 140–400)
RBC: 4.82 10*6/uL (ref 4.20–5.80)
RDW: 12.4 % (ref 11.0–15.0)
WBC: 8.4 10*3/uL (ref 3.8–10.8)

## 2020-02-02 LAB — LIPID PANEL
Cholesterol: 158 mg/dL (ref <200)
HDL: 38 mg/dL — ABNORMAL LOW (ref >=40)
LDL Cholesterol (Calc): 96 mg/dL (calc)
Non-HDL Cholesterol (Calc): 120 mg/dL (calc) (ref <130)
Total CHOL/HDL Ratio: 4.2 (calc) (ref <5.0)
Triglycerides: 138 mg/dL (ref <150)

## 2020-02-02 LAB — HEMOGLOBIN A1C
Hgb A1c MFr Bld: 5.4 % of total Hgb (ref <5.7)
Mean Plasma Glucose: 108 mg/dL
eAG (mmol/L): 6 mmol/L

## 2020-02-04 NOTE — Progress Notes (Signed)
Overall labs look good. Cholesterol is much improved from last year. Continue medication and watch your diet- limit foods high in saturated fat, cholesterol, and carbohydrates. There is a very slight bump in one of the liver enzymes. Be sure that you are using Tylenol in moderation and limit your alcohol consumption to help prevent this from increasing. Continue medication as prescribed.

## 2020-04-28 ENCOUNTER — Telehealth: Payer: Self-pay

## 2020-04-28 ENCOUNTER — Encounter: Payer: Self-pay | Admitting: Physician Assistant

## 2020-04-28 ENCOUNTER — Telehealth (INDEPENDENT_AMBULATORY_CARE_PROVIDER_SITE_OTHER): Payer: Self-pay | Admitting: Physician Assistant

## 2020-04-28 VITALS — HR 92 | Temp 98.4°F | Ht 72.0 in | Wt 280.0 lb

## 2020-04-28 DIAGNOSIS — J449 Chronic obstructive pulmonary disease, unspecified: Secondary | ICD-10-CM

## 2020-04-28 DIAGNOSIS — J441 Chronic obstructive pulmonary disease with (acute) exacerbation: Secondary | ICD-10-CM

## 2020-04-28 MED ORDER — PREDNISONE 50 MG PO TABS: ORAL_TABLET | ORAL | 0 refills | Status: DC

## 2020-04-28 MED ORDER — GUAIFENESIN-CODEINE 100-10 MG/5ML PO SYRP: 5.0000 mL | ORAL_SOLUTION | Freq: Three times a day (TID) | ORAL | 0 refills | Status: DC | PRN

## 2020-04-28 MED ORDER — LEVOFLOXACIN 500 MG PO TABS: 500.0000 mg | ORAL_TABLET | Freq: Every day | ORAL | 0 refills | Status: AC

## 2020-04-28 NOTE — Telephone Encounter (Signed)
Thank you :)

## 2020-04-28 NOTE — Progress Notes (Deleted)
Started yesterday  Congestion in chest Fatigu/body aches.  Dizziness Headache (last month)  Taken some OTC Alka-Seltzer Cold Med - no help  No signicant changes in breathing.

## 2020-04-28 NOTE — Telephone Encounter (Signed)
Patient has been scheduled this afternoon with Tandy Gaw. AM

## 2020-04-28 NOTE — Telephone Encounter (Signed)
Pt called requesting a virtual appointment for severe congestion and cough. Per pt, he thinks it might be bronchitis. Pls contact patient for scheduling. Thanks in advance.

## 2020-04-29 NOTE — Telephone Encounter (Signed)
Letter written and given to the front desk to email to patient.  ?

## 2020-04-29 NOTE — Progress Notes (Signed)
Patient ID: Eric Baird, male   DOB: 11/24/1960, 60 y.o.   MRN: [id number] .Marland KitchenVirtual Visit via Telephone Note  I connected with Eric Baird on 04/28/2020 at  2:40 PM EDT by telephone and verified that I am speaking with the correct person using two identifiers.  Location: Patient: home Provider: clinic  .Marland KitchenParticipating in visit:  Patient: Eric Baird  Provider: Tandy Gaw PA-c   I discussed the limitations, risks, security and privacy concerns of performing an evaluation and management service by telephone and the availability of in person appointments. I also discussed with the patient that there may be a patient responsible charge related to this service. The patient expressed understanding and agreed to proceed.   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  He admits to be using his albuterol inhaler frequently with little relief.  His pulse ox has been stable at 97%.  He does report feeling short of breath.  He has been taking over-the-counter Alka-Seltzer cold med with little help as well.  He has a history of frequent COPD exacerbations.  He continues to smoke.  He did have a negative rapid COVID test and has had 1 Janssen COVID-vaccine.  Patient denies any lower extremity swelling, fever, chills, GI symptoms.  .. Active Ambulatory Problems    Diagnosis Date Noted  . Essential hypertension 11/03/2015  . Tobacco use disorder, continuous 11/03/2015  . Stage 2 moderate COPD by GOLD classification (HCC) 01/14/2016  . Fatigue 07/29/2016  . Generalized muscle ache 07/29/2016  . Abnormal electrocardiogram (ECG) (EKG) 04/15/2017  . Mixed hyperlipidemia 11/06/2018  . Body mass index (BMI) of 39.0-39.9 in adult 01/31/2020  . Tobacco abuse counseling 01/31/2020   Resolved Ambulatory Problems    Diagnosis Date Noted   . Fever chills 07/29/2016   Past Medical History:  Diagnosis Date  . Tobacco dependence 11/03/2015       Observations/Objective: No acute distress Productive cough no labored breathing  .[name] Today's Vitals   04/28/20 1434  Pulse: 92  Temp: 98.4 F (36.9 C)  TempSrc: Oral  SpO2: 97%  Weight: 280 lb (127 kg)  Height: 6' (1.829 m)   Body mass index is 37.97 kg/m.    Assessment and Plan: Marland KitchenMarland KitchenRichard was seen today for nasal congestion and cough.  Diagnoses and all orders for this visit:  COPD exacerbation (HCC) -     levofloxacin (LEVAQUIN) 500 MG tablet; Take 1 tablet (500 mg total) by mouth daily for 7 days. -     predniSONE (DELTASONE) 50 MG tablet; One tab PO daily for 5 days. -     guaiFENesin-codeine (ROBITUSSIN AC) 100-10 MG/5ML syrup; Take 5 mLs by mouth 3 (three) times daily as needed for cough.  Stage 2 moderate COPD by GOLD classification (HCC)   Negative COVID rapid home test.  Likely some other virus causing a COPD exacerbation.  Patient seems to be having more more of these.  Suggest follow-up with PCP to discuss changing medications.  Perhaps something like Trelegy or Markus Daft would improve symptoms.  Reviewed chart patient has had numerous medications.  Last antibiotic seems to be Doxy.  Will alternate rx and try levaquin, prednisone, cough syrup.  Written out of work for today, tomorrow and Wednesday. Follow up if lung function is worsening or symptoms not improving.  Discussed good deep breathing.    Follow Up  Instructions:    I discussed the assessment and treatment plan with the patient. The patient was provided an opportunity to ask questions and all were answered. The patient agreed with the plan and demonstrated an understanding of the instructions.   The patient was advised to call back or seek an in-person evaluation if the symptoms worsen or if the condition fails to improve as anticipated.  I provided 10 minutes of non-face-to-face time during  this encounter.   Tandy Gaw, PA-C

## 2020-04-29 NOTE — Telephone Encounter (Signed)
Ok for note for yesterday, today and tomorrow to email to patient.

## 2020-09-23 ENCOUNTER — Encounter: Payer: Self-pay | Admitting: Osteopathic Medicine

## 2020-09-23 ENCOUNTER — Other Ambulatory Visit: Payer: Self-pay

## 2020-09-23 ENCOUNTER — Ambulatory Visit (INDEPENDENT_AMBULATORY_CARE_PROVIDER_SITE_OTHER): Payer: Self-pay | Admitting: Osteopathic Medicine

## 2020-09-23 VITALS — BP 129/78 | HR 87 | Temp 97.4°F | Ht 72.0 in | Wt 282.0 lb

## 2020-09-23 DIAGNOSIS — I10 Essential (primary) hypertension: Secondary | ICD-10-CM

## 2020-09-23 DIAGNOSIS — Z791 Long term (current) use of non-steroidal anti-inflammatories (NSAID): Secondary | ICD-10-CM

## 2020-09-23 DIAGNOSIS — J449 Chronic obstructive pulmonary disease, unspecified: Secondary | ICD-10-CM

## 2020-09-23 DIAGNOSIS — F17200 Nicotine dependence, unspecified, uncomplicated: Secondary | ICD-10-CM

## 2020-09-23 DIAGNOSIS — E782 Mixed hyperlipidemia: Secondary | ICD-10-CM

## 2020-09-23 DIAGNOSIS — Z23 Encounter for immunization: Secondary | ICD-10-CM

## 2020-09-23 MED ORDER — OMEPRAZOLE 20 MG PO CPDR: 20.0000 mg | DELAYED_RELEASE_CAPSULE | Freq: Every day | ORAL | 3 refills | Status: DC

## 2020-09-23 MED ORDER — ATORVASTATIN CALCIUM 20 MG PO TABS: 20.0000 mg | ORAL_TABLET | Freq: Every day | ORAL | 3 refills | Status: DC

## 2020-09-23 MED ORDER — ALBUTEROL SULFATE HFA 108 (90 BASE) MCG/ACT IN AERS: 1.0000 | INHALATION_SPRAY | RESPIRATORY_TRACT | 99 refills | Status: DC | PRN

## 2020-09-23 MED ORDER — LISINOPRIL 10 MG PO TABS: 10.0000 mg | ORAL_TABLET | Freq: Every day | ORAL | 3 refills | Status: DC

## 2020-09-23 MED ORDER — BUPROPION HCL ER (XL) 300 MG PO TB24: 300.0000 mg | ORAL_TABLET | Freq: Every day | ORAL | 3 refills | Status: DC

## 2020-09-23 NOTE — Patient Instructions (Addendum)
IBUPROFEN UP TO 600 MG THREE TIMES PER DAY  OR ALEVE UP TO 375 MG THREE TIMES PER DAY  IF USING THESE DOSES OFTEN, WOULD ADD ANTACID FOR STOMACH ULCER PREVENTION (SENT RC FOR OMEPRAZOLE)    PREVNAR-20 PNEUMONIA SHOT TODAY, BOOSTER AT AGE 62  SHINGRIX SHINGLES SHOT TODAY, SECOND DOSE IN 2-6 MOS  FLU SHOT EVERY FALL   Influenza (Flu) Vaccine (Inactivated or Recombinant): What You Need to Know 1. Why get vaccinated? Influenza vaccine can prevent influenza (flu). Flu is a contagious disease that spreads around the [place] every year, usually between October and May. Anyone can get the flu, but it is more dangerous for some people. Infants and young children, people 28 years and older, pregnant people, and people with certain health conditions or a weakened immune system are at greatest risk of flu complications. Pneumonia, bronchitis, sinus infections, and ear infections are examples of flu-related complications. If you have a medical condition, such as heart disease, cancer, or diabetes, flu can make it worse. Flu can cause fever and chills, sore throat, muscle aches, fatigue, cough, headache, and runny or stuffy nose. Some people may have vomiting and diarrhea, though this is more common in children than adults. In an average year, thousands of people in the [place] die from flu, and many more are hospitalized. Flu vaccine prevents millions of illnesses and flu-related visits to the doctor each year. 2. Influenza vaccines CDC recommends everyone 6 months and older get vaccinated every flu season. Children 6 months through 51 years of age may need 2 doses during a single flu season. Everyone else needs only 1 dose each flu season. It takes about 2 weeks for protection to develop after vaccination. There are many flu viruses, and they are always changing. Each year a new flu vaccine is made to protect against the influenza viruses believed to be likely to cause disease in the upcoming flu  season. Even when the vaccine doesn't exactly match these viruses, it may still provide some protection. Influenza vaccine does not cause flu. Influenza vaccine may be given at the same time as other vaccines. 3. Talk with your health care provider Tell your vaccination provider if the person getting the vaccine: Has had an allergic reaction after a previous dose of influenza vaccine, or has any severe, life-threatening allergies Has ever had Guillain-Barr Syndrome (also called "GBS") In some cases, your health care provider may decide to postpone influenza vaccination until a future visit. Influenza vaccine can be administered at any time during pregnancy. People who are or will be pregnant during influenza season should receive inactivated influenza vaccine. People with minor illnesses, such as a cold, may be vaccinated. People who are moderately or severely ill should usually wait until they recover before getting influenza vaccine. Your health care provider can give you more information. 4. Risks of a vaccine reaction Soreness, redness, and swelling where the shot is given, fever, muscle aches, and headache can happen after influenza vaccination. There may be a very small increased risk of Guillain-Barr Syndrome (GBS) after inactivated influenza vaccine (the flu shot). Young children who get the flu shot along with pneumococcal vaccine (PCV13) and/or DTaP vaccine at the same time might be slightly more likely to have a seizure caused by fever. Tell your health care provider if a child who is getting flu vaccine has ever had a seizure. People sometimes faint after medical procedures, including vaccination. Tell your provider if you feel dizzy or have vision changes or  ringing in the ears. As with any medicine, there is a very remote chance of a vaccine causing a severe allergic reaction, other serious injury, or death. 5. What if there is a serious problem? An allergic reaction could occur  after the vaccinated person leaves the clinic. If you see signs of a severe allergic reaction (hives, swelling of the face and throat, difficulty breathing, a fast heartbeat, dizziness, or weakness), call 9-1-1 and get the person to the nearest hospital. For other signs that concern you, call your health care provider. Adverse reactions should be reported to the Vaccine Adverse Event Reporting System (VAERS). Your health care provider will usually file this report, or you can do it yourself. Visit the VAERS website at www.vaers.LAgents.no or call (769)346-7119. VAERS is only for reporting reactions, and VAERS staff members do not give medical advice. 6. The National Vaccine Injury Compensation Program The [employer] Vaccine Injury Compensation Program (VICP) is a federal program that was created to compensate people who may have been injured by certain vaccines. Claims regarding alleged injury or death due to vaccination have a time limit for filing, which may be as short as two years. Visit the VICP website at SpiritualWord.at or call 404-746-8101 to learn about the program and about filing a claim. 7. How can I learn more? Ask your health care provider. Call your local or state health department. Visit the website of the Food and Drug Administration (FDA) for vaccine package inserts and additional information at FinderList.no. Contact the Centers for Disease Control and Prevention (CDC): Call (802)602-7416 (1-800-CDC-INFO) or Visit CDC's website at BiotechRoom.com.cy. Vaccine Information Statement Inactivated Influenza Vaccine (08/10/2019) This information is not intended to replace advice given to you by your health care provider. Make sure you discuss any questions you have with your health care provider. Document Revised: 09/27/2019 Document Reviewed: 09/27/2019 Elsevier Patient Education  2022 Elsevier Inc.   Pneumococcal Polysaccharide Vaccine  (PPSV23): What You Need to Know 1. Why get vaccinated? Pneumococcal polysaccharide vaccine (PPSV23) can prevent pneumococcal disease. Pneumococcal disease refers to any illness caused by pneumococcal bacteria. These bacteria can cause many types of illnesses, including pneumonia, which is an infection of the lungs. Pneumococcal bacteria are one of the most common causes of pneumonia. Besides pneumonia, pneumococcal bacteria can also cause: Ear infections Sinus infections Meningitis (infection of the tissue covering the brain and spinal cord) Bacteremia (bloodstream infection) Anyone can get pneumococcal disease, but children under 73 years of age, people with certain medical conditions, adults 65 years or older, and cigarette smokers are at the highest risk. Most pneumococcal infections are mild. However, some can result in long-term problems, such as brain damage or hearing loss. Meningitis, bacteremia, and pneumonia caused by pneumococcal disease can be fatal. 2. PPSV23 PPSV23 protects against 23 types of bacteria that cause pneumococcal disease. PPSV23 is recommended for: All adults 65 years or older, Anyone 2 years or older with certain medical conditions that can lead to an increased risk for pneumococcal disease. Most people need only one dose of PPSV23. A second dose of PPSV23, and another type of pneumococcal vaccine called PCV13, are recommended for certain high-risk groups. Your health care provider can give you more information. People 65 years or older should get a dose of PPSV23 even if they have already gotten one or more doses of the vaccine before they turned 41. 3. Talk with your health care provider Tell your vaccine provider if the person getting the vaccine: Has had an allergic reaction after a  previous dose of PPSV23, or has any severe, life-threatening allergies. In some cases, your health care provider may decide to postpone PPSV23 vaccination to a future visit. People  with minor illnesses, such as a cold, may be vaccinated. People who are moderately or severely ill should usually wait until they recover before getting PPSV23. Your health care provider can give you more information. 4. Risks of a vaccine reaction Redness or pain where the shot is given, feeling tired, fever, or muscle aches can happen after PPSV23. People sometimes faint after medical procedures, including vaccination. Tell your provider if you feel dizzy or have vision changes or ringing in the ears. As with any medicine, there is a very remote chance of a vaccine causing a severe allergic reaction, other serious injury, or death. 5. What if there is a serious problem? An allergic reaction could occur after the vaccinated person leaves the clinic. If you see signs of a severe allergic reaction (hives, swelling of the face and throat, difficulty breathing, a fast heartbeat, dizziness, or weakness), call 9-1-1 and get the person to the nearest hospital. For other signs that concern you, call your health care provider. Adverse reactions should be reported to the Vaccine Adverse Event Reporting System (VAERS). Your health care provider will usually file this report, or you can do it yourself. Visit the VAERS website at www.vaers.LAgents.no or call 4027373747. VAERS is only for reporting reactions, and VAERS staff do not give medical advice. 6. How can I learn more? Ask your health care provider. Call your local or state health department. Contact the Centers for Disease Control and Prevention (CDC): Call 954-465-8705 (1-800-CDC-INFO) or Visit CDC's website at PicCapture.uy Vaccine Information Statement PPSV23 Vaccine (11/02/2017) This information is not intended to replace advice given to you by your health care provider. Make sure you discuss any questions you have with your health care provider. Document Revised: 08/24/2019 Document Reviewed: 08/24/2019 Elsevier Patient Education  2022  Elsevier Inc.   Recombinant Zoster (Shingles) Vaccine: What You Need to Know 1. Why get vaccinated? Recombinant zoster (shingles) vaccine can prevent shingles. Shingles (also called herpes zoster, or just zoster) is a painful skin rash, usually with blisters. In addition to the rash, shingles can cause fever, headache, chills, or upset stomach. Rarely, shingles can lead to complications such as pneumonia, hearing problems, blindness, brain inflammation (encephalitis), or death. The risk of shingles increases with age. The most common complication of shingles is long-term nerve pain called postherpetic neuralgia (PHN). PHN occurs in the areas where the shingles rash was and can last for months or years after the rash goes away. The pain from PHN can be severe and debilitating. The risk of PHN increases with age. An older adult with shingles is more likely to develop PHN and have longer lasting and more severe pain than a younger person. People with weakened immune systems also have a higher risk of getting shingles and complications from the disease. Shingles is caused by varicella-zoster virus, the same virus that causes chickenpox. After you have chickenpox, the virus stays in your body and can cause shingles later in life. Shingles cannot be passed from one person to another, but the virus that causes shingles can spread and cause chickenpox in someone who has never had chickenpox or has never received chickenpox vaccine. 2. Recombinant shingles vaccine Recombinant shingles vaccine provides strong protection against shingles. By preventing shingles, recombinant shingles vaccine also protects against PHN and other complications. Recombinant shingles vaccine is recommended for: Adults 50  years and older Adults 19 years and older who have a weakened immune system because of disease or treatments Shingles vaccine is given as a two-dose series. For most people, the second dose should be given 2 to 6  months after the first dose. Some people who have or will have a weakened immune system can get the second dose 1 to 2 months after the first dose. Ask your health care provider for guidance. People who have had shingles in the past and people who have received varicella (chickenpox) vaccine are recommended to get recombinant shingles vaccine. The vaccine is also recommended for people who have already gotten another type of shingles vaccine, the live shingles vaccine. There is no live virus in recombinant shingles vaccine. Shingles vaccine may be given at the same time as other vaccines. 3. Talk with your health care provider Tell your vaccination provider if the person getting the vaccine: Has had an allergic reaction after a previous dose of recombinant shingles vaccine, or has any severe, life-threatening allergies Is currently experiencing an episode of shingles Is pregnant In some cases, your health care provider may decide to postpone shingles vaccination until a future visit. People with minor illnesses, such as a cold, may be vaccinated. People who are moderately or severely ill should usually wait until they recover before getting recombinant shingles vaccine. Your health care provider can give you more information. 4. Risks of a vaccine reaction A sore arm with mild or moderate pain is very common after recombinant shingles vaccine. Redness and swelling can also happen at the site of the injection. Tiredness, muscle pain, headache, shivering, fever, stomach pain, and nausea are common after recombinant shingles vaccine. These side effects may temporarily prevent a vaccinated person from doing regular activities. Symptoms usually go away on their own in 2 to 3 days. You should still get the second dose of recombinant shingles vaccine even if you had one of these reactions after the first dose. Guillain-Barr syndrome (GBS), a serious nervous system disorder, has been reported very rarely  after recombinant zoster vaccine. People sometimes faint after medical procedures, including vaccination. Tell your provider if you feel dizzy or have vision changes or ringing in the ears. As with any medicine, there is a very remote chance of a vaccine causing a severe allergic reaction, other serious injury, or death. 5. What if there is a serious problem? An allergic reaction could occur after the vaccinated person leaves the clinic. If you see signs of a severe allergic reaction (hives, swelling of the face and throat, difficulty breathing, a fast heartbeat, dizziness, or weakness), call 9-1-1 and get the person to the nearest hospital. For other signs that concern you, call your health care provider. Adverse reactions should be reported to the Vaccine Adverse Event Reporting System (VAERS). Your health care provider will usually file this report, or you can do it yourself. Visit the VAERS website at www.vaers.LAgents.no or call (463)358-6963. VAERS is only for reporting reactions, and VAERS staff members do not give medical advice. 6. How can I learn more? Ask your health care provider. Call your local or state health department. Visit the website of the Food and Drug Administration (FDA) for vaccine package inserts and additional information at GoldCloset.com.ee. Contact the Centers for Disease Control and Prevention (CDC): Call 785-657-7374 (1-800-CDC-INFO) or Visit CDC's website at PicCapture.uy. Vaccine Information Statement Recombinant Zoster Vaccine (02/08/2020) This information is not intended to replace advice given to you by your health care provider. Make sure you  discuss any questions you have with your health care provider. Document Revised: 02/22/2020 Document Reviewed: 02/22/2020 Elsevier Patient Education  2022 [employer].

## 2020-09-23 NOTE — Progress Notes (Signed)
Eric Baird is a 60 y.o. male who presents to  [facility] at [facility]  today, 09/23/20, seeking care for the following:  Chief Complaint  Patient presents with   Hypertension BP Readings from Last 3 Encounters:  09/23/20 129/78  01/31/20 132/75  06/04/19 117/80  Taking lisinopril 10 mg daily    Hyperlipidemia: taking lipitor 40 mg last lipids LDL 96 01/2020   COPD: albuterol prn,  not taking symbicort d/t cost    Has been some time since f/u w/ me, last seen by me 12/2018, has had a few visit in the interim for COPD/HTN    ASSESSMENT & PLAN with other pertinent findings:  The primary encounter diagnosis was Essential hypertension. Diagnoses of Mixed hyperlipidemia, Stage 2 moderate COPD by GOLD classification (HCC), Need for shingles vaccine, Tobacco dependence, NSAID long-term use, Need for pneumococcal vaccination, and Need for influenza vaccination were also pertinent to this visit.   Doing well overall except needign meds - will refer to clinical pharmacy to see if they can arrange Rx assistance for Symbicort or similar  Orders Placed This Encounter  Procedures   Varicella-zoster vaccine IM   Pneumococcal conjugate vaccine 20-valent   Flu Vaccine QUAD 52mo+IM (Fluarix, Fluzone & Alfiuria Quad PF)    Meds ordered this encounter  Medications   lisinopril (ZESTRIL) 10 MG tablet    Sig: Take 1 tablet (10 mg total) by mouth daily.    Dispense:  90 tablet    Refill:  3   atorvastatin (LIPITOR) 20 MG tablet    Sig: Take 1 tablet (20 mg total) by mouth daily.    Dispense:  90 tablet    Refill:  3   buPROPion (WELLBUTRIN XL) 300 MG 24 hr tablet    Sig: Take 1 tablet (300 mg total) by mouth daily.    Dispense:  90 tablet    Refill:  3   albuterol (VENTOLIN HFA) 108 (90 Base) MCG/ACT inhaler    Sig: Inhale 1-2 puffs into the lungs every 4 (four) hours as needed for wheezing or shortness of breath. PLEASE RUN W/ GOODRX COUPON     Dispense:  18 g    Refill:  99   omeprazole (PRILOSEC) 20 MG capsule    Sig: Take 1 capsule (20 mg total) by mouth daily.    Dispense:  90 capsule    Refill:  3     See below for relevant physical exam findings  See below for recent lab and imaging results reviewed  Medications, allergies, PMH, PSH, SocH, FamH reviewed below    Follow-up instructions: Return in about 6 months (around 03/23/2021) for ESTABLISH W/ JOY, SHINGRIX #2, SEE [place] SOONER IF NEEDED.                                        Exam:  BP 129/78   Pulse 87   Temp (!) 97.4 F (36.3 C)   Ht 6' (1.829 m)   Wt 282 lb (127.9 kg)   SpO2 99%   BMI 38.25 kg/m  Constitutional: VS see above. General Appearance: alert, well-developed, well-nourished, NAD Neck: No masses, trachea midline.  Respiratory: Normal respiratory effort. no wheeze, no rhonchi, no rales Cardiovascular: S1/S2 normal, no murmur, no rub/gallop auscultated. RRR.  Musculoskeletal: Gait normal. Symmetric and independent movement of all extremities Abdominal: non-tender, non-distended, no appreciable organomegaly, neg Murphy's,  BS [id number] Neurological: Normal balance/coordination. No tremor. Skin: warm, dry, intact.  Psychiatric: Normal judgment/insight. Normal mood and affect. Oriented x3.   Current Meds  Medication Sig   omeprazole (PRILOSEC) 20 MG capsule Take 1 capsule (20 mg total) by mouth daily.   [DISCONTINUED] albuterol (VENTOLIN HFA) 108 (90 Base) MCG/ACT inhaler Inhale 1-2 puffs into the lungs every 6 (six) hours as needed for wheezing or shortness of breath.   [DISCONTINUED] atorvastatin (LIPITOR) 40 MG tablet Take 1/2 (one-half) tablet by mouth once daily   [DISCONTINUED] buPROPion (WELLBUTRIN XL) 300 MG 24 hr tablet Take 1 tablet (300 mg total) by mouth daily.   [DISCONTINUED] lisinopril (ZESTRIL) 10 MG tablet TAKE 1 TABLET BY MOUTH ONCE DAILY    No Known Allergies  Patient Active Problem List    Diagnosis Date Noted   Body mass index (BMI) of 39.0-39.9 in adult 01/31/2020   Tobacco abuse counseling 01/31/2020   Mixed hyperlipidemia 11/06/2018   Abnormal electrocardiogram (ECG) (EKG) 04/15/2017   Fatigue 07/29/2016   Generalized muscle ache 07/29/2016   Stage 2 moderate COPD by GOLD classification (HCC) 01/14/2016   Essential hypertension 11/03/2015   Tobacco use disorder, continuous 11/03/2015    Family History  Family history unknown: Yes    Social History   Tobacco Use  Smoking Status Every Day   Packs/day: 1.00   Types: Cigarettes  Smokeless Tobacco Never    Past Surgical History:  Procedure Laterality Date   KNEE SURGERY Right     Immunization History  Administered Date(s) Administered   Influenza,inj,Quad PF,6+ Mos 12/16/2015, 11/22/2016, 10/31/2018, 09/23/2020   Janssen (J&J) SARS-COV-2 Vaccination 05/25/2019   PNEUMOCOCCAL CONJUGATE-20 09/23/2020   Tdap 10/01/2014   Zoster Recombinat (Shingrix) 09/23/2020    No results found for this or any previous visit (from the past 2160 hour(s)).  No results found.     All questions at time of visit were answered - patient instructed to contact office with any additional concerns or updates. ER/RTC precautions were reviewed with the patient as applicable.   Please note: manual typing as well as voice recognition software may have been used to produce this document - typos may escape review. Please contact Dr. Lyn Hollingshead for any needed clarifications.

## 2021-02-20 ENCOUNTER — Ambulatory Visit (INDEPENDENT_AMBULATORY_CARE_PROVIDER_SITE_OTHER): Payer: Self-pay | Admitting: Physician Assistant

## 2021-02-20 ENCOUNTER — Encounter: Payer: Self-pay | Admitting: Physician Assistant

## 2021-02-20 ENCOUNTER — Ambulatory Visit (INDEPENDENT_AMBULATORY_CARE_PROVIDER_SITE_OTHER): Payer: Self-pay

## 2021-02-20 ENCOUNTER — Other Ambulatory Visit: Payer: Self-pay

## 2021-02-20 VITALS — BP 123/79 | HR 98 | Ht 72.0 in | Wt 298.0 lb

## 2021-02-20 DIAGNOSIS — M545 Low back pain, unspecified: Secondary | ICD-10-CM

## 2021-02-20 MED ORDER — MELOXICAM 15 MG PO TABS: 15.0000 mg | ORAL_TABLET | Freq: Every day | ORAL | 1 refills | Status: DC

## 2021-02-20 MED ORDER — PREDNISONE 50 MG PO TABS: ORAL_TABLET | ORAL | 0 refills | Status: DC

## 2021-02-20 NOTE — Patient Instructions (Signed)
Low Back Sprain or Strain Rehab Ask your health care provider which exercises are safe for you. Do exercises exactly as told by your health care provider and adjust them as directed. It is normal to feel mild stretching, pulling, tightness, or discomfort as you do these exercises. Stop right away if you feel sudden pain or your pain gets worse. Do not begin these exercises until told by your health care provider. Stretching and range-of-motion exercises These exercises warm up your muscles and joints and improve the movement and flexibility of your back. These exercises also help to relieve pain, numbness, and tingling. Lumbar rotation  Lie on your back on a firm bed or the floor with your knees bent. Straighten your arms out to your sides so each arm forms a 90-degree angle (right angle) with a side of your body. Slowly move (rotate) both of your knees to one side of your body until you feel a stretch in your lower back (lumbar). Try not to let your shoulders lift off the floor. Hold this position for __________ seconds. Tense your abdominal muscles and slowly move your knees back to the starting position. Repeat this exercise on the other side of your body. Repeat __________ times. Complete this exercise __________ times a day. Single knee to chest  Lie on your back on a firm bed or the floor with both legs straight. Bend one of your knees. Use your hands to move your knee up toward your chest until you feel a gentle stretch in your lower back and buttock. Hold your leg in this position by holding on to the front of your knee. Keep your other leg as straight as possible. Hold this position for __________ seconds. Slowly return to the starting position. Repeat with your other leg. Repeat __________ times. Complete this exercise __________ times a day. Prone extension on elbows  Lie on your abdomen on a firm bed or the floor (prone position). Prop yourself up on your elbows. Use your arms  to help lift your chest up until you feel a gentle stretch in your abdomen and your lower back. This will place some of your body weight on your elbows. If this is uncomfortable, try stacking pillows under your chest. Your hips should stay down, against the surface that you are lying on. Keep your hip and back muscles relaxed. Hold this position for __________ seconds. Slowly relax your upper body and return to the starting position. Repeat __________ times. Complete this exercise __________ times a day. Strengthening exercises These exercises build strength and endurance in your back. Endurance is the ability to use your muscles for a long time, even after they get tired. Pelvic tilt This exercise strengthens the muscles that lie deep in the abdomen. Lie on your back on a firm bed or the floor with your legs extended. Bend your knees so they are pointing toward the ceiling and your feet are flat on the floor. Tighten your lower abdominal muscles to press your lower back against the floor. This motion will tilt your pelvis so your tailbone points up toward the ceiling instead of pointing to your feet or the floor. To help with this exercise, you may place a small towel under your lower back and try to push your back into the towel. Hold this position for __________ seconds. Let your muscles relax completely before you repeat this exercise. Repeat __________ times. Complete this exercise __________ times a day. Alternating arm and leg raises  Get on your hands   and knees on a firm surface. If you are on a hard floor, you may want to use padding, such as an exercise mat, to cushion your knees. Line up your arms and legs. Your hands should be directly below your shoulders, and your knees should be directly below your hips. Lift your left leg behind you. At the same time, raise your right arm and straighten it in front of you. Do not lift your leg higher than your hip. Do not lift your arm higher  than your shoulder. Keep your abdominal and back muscles tight. Keep your hips facing the ground. Do not arch your back. Keep your balance carefully, and do not hold your breath. Hold this position for __________ seconds. Slowly return to the starting position. Repeat with your right leg and your left arm. Repeat __________ times. Complete this exercise __________ times a day. Abdominal set with straight leg raise  Lie on your back on a firm bed or the floor. Bend one of your knees and keep your other leg straight. Tense your abdominal muscles and lift your straight leg up, 4-6 inches (10-15 cm) off the ground. Keep your abdominal muscles tight and hold this position for __________ seconds. Do not hold your breath. Do not arch your back. Keep it flat against the ground. Keep your abdominal muscles tense as you slowly lower your leg back to the starting position. Repeat with your other leg. Repeat __________ times. Complete this exercise __________ times a day. Single leg lower with bent knees Lie on your back on a firm bed or the floor. Tense your abdominal muscles and lift your feet off the floor, one foot at a time, so your knees and hips are bent in 90-degree angles (right angles). Your knees should be over your hips and your lower legs should be parallel to the floor. Keeping your abdominal muscles tense and your knee bent, slowly lower one of your legs so your toe touches the ground. Lift your leg back up to return to the starting position. Do not hold your breath. Do not let your back arch. Keep your back flat against the ground. Repeat with your other leg. Repeat __________ times. Complete this exercise __________ times a day. Posture and body mechanics Good posture and healthy body mechanics can help to relieve stress in your body's tissues and joints. Body mechanics refers to the movements and positions of your body while you do your daily activities. Posture is part of body  mechanics. Good posture means: Your spine is in its natural S-curve position (neutral). Your shoulders are pulled back slightly. Your head is not tipped forward (neutral). Follow these guidelines to improve your posture and body mechanics in your everyday activities. Standing  When standing, keep your spine neutral and your feet about hip-width apart. Keep a slight bend in your knees. Your ears, shoulders, and hips should line up. When you do a task in which you stand in one place for a long time, place one foot up on a stable object that is 2-4 inches (5-10 cm) high, such as a footstool. This helps keep your spine neutral. Sitting  When sitting, keep your spine neutral and keep your feet flat on the floor. Use a footrest, if necessary, and keep your thighs parallel to the floor. Avoid rounding your shoulders, and avoid tilting your head forward. When working at a desk or a computer, keep your desk at a height where your hands are slightly lower than your elbows. Slide your   chair under your desk so you are close enough to maintain good posture. When working at a computer, place your monitor at a height where you are looking straight ahead and you do not have to tilt your head forward or downward to look at the screen. Resting When lying down and resting, avoid positions that are most painful for you. If you have pain with activities such as sitting, bending, stooping, or squatting, lie in a position in which your body does not bend very much. For example, avoid curling up on your side with your arms and knees near your chest (fetal position). If you have pain with activities such as standing for a long time or reaching with your arms, lie with your spine in a neutral position and bend your knees slightly. Try the following positions: Lying on your side with a pillow between your knees. Lying on your back with a pillow under your knees. Lifting  When lifting objects, keep your feet at least  shoulder-width apart and tighten your abdominal muscles. Bend your knees and hips and keep your spine neutral. It is important to lift using the strength of your legs, not your back. Do not lock your knees straight out. Always ask for help to lift heavy or awkward objects. This information is not intended to replace advice given to you by your health care provider. Make sure you discuss any questions you have with your health care provider. Document Revised: 03/10/2020 Document Reviewed: 03/10/2020 Elsevier Patient Education  2022 [employer]  

## 2021-02-20 NOTE — Progress Notes (Signed)
° °  Subjective:    Patient ID: Eric Baird, male    DOB: 05/11/1960, 61 y.o.   MRN: [id number]  Back Pain Pertinent negatives include no chest pain, fever, numbness or weakness.  This patient is a well appearing 61 year old male presenting with chief complaint of lower left-sided back pain. This pain is described as "achey", has been consistent for 2 weeks and is ranked as a 7-8 out of 10. The pain is affecting his sleep and only allowing 2-3 hours of sleep per night. No aggravating or alleviating factors. He denies any urinary symptoms (dysuria, hematuria, frequency, urgency). Denies any type of trauma.  Review of Systems  Constitutional: Negative.  Negative for fatigue, fever and unexpected weight change.  Cardiovascular:  Negative for chest pain, palpitations and leg swelling.  Gastrointestinal: Negative.  Negative for diarrhea, nausea and vomiting.  Musculoskeletal:  Positive for back pain (low, left-sided). Negative for gait problem and neck pain.  Neurological: Negative.  Negative for dizziness, weakness and numbness.      Objective:   Physical Exam Constitutional:      General: He is not in acute distress.    Appearance: Normal appearance.  HENT:     Head: Normocephalic and atraumatic.  Cardiovascular:     Rate and Rhythm: Normal rate and regular rhythm.     Heart sounds: Normal heart sounds. No murmur heard.   No friction rub. No gallop.  Pulmonary:     Breath sounds: Normal breath sounds.  Musculoskeletal:        General: Tenderness (left lower back) present. No swelling or signs of injury. Normal range of motion.     Cervical back: Normal range of motion and neck supple. No tenderness.     Left lower leg: No edema.  Neurological:     General: No focal deficit present.     Mental Status: He is alert and oriented to person, place, and time.     Motor: No weakness.     Gait: Gait normal.          Assessment & Plan:  Marland KitchenMarland KitchenRichard was seen today for back  pain.  Diagnoses and all orders for this visit:  Acute left-sided low back pain without sciatica -     DG Lumbar Spine Complete; Future -     meloxicam (MOBIC) 15 MG tablet; Take 1 tablet (15 mg total) by mouth daily. -     predniSONE (DELTASONE) 50 MG tablet; Take one tablet for 5 days.   No red flags.  Needs xrays.  Prednisone for 5 days then mobic daily.  Pt declined muscle relaxer or pain rx for breakthrough pain.  Discussed good lifting techniques and exercises that help strength back.

## 2021-02-23 ENCOUNTER — Telehealth: Payer: Self-pay

## 2021-02-23 ENCOUNTER — Encounter: Payer: Self-pay | Admitting: Physician Assistant

## 2021-02-23 DIAGNOSIS — M5136 Other intervertebral disc degeneration, lumbar region: Secondary | ICD-10-CM | POA: Insufficient documentation

## 2021-02-23 NOTE — Telephone Encounter (Signed)
Eric Baird called and left a message that he is still in pain. The medications are not helping. Please advise.

## 2021-02-23 NOTE — Progress Notes (Signed)
Lots of degenerative in the lumbar spine. Worse changes at L4/L5 and L5/S1.  Treatment plan the same.

## 2021-02-24 ENCOUNTER — Encounter: Payer: Self-pay | Admitting: Physician Assistant

## 2021-02-24 MED ORDER — CYCLOBENZAPRINE HCL 5 MG PO TABS: 5.0000 mg | ORAL_TABLET | Freq: Three times a day (TID) | ORAL | 0 refills | Status: DC | PRN

## 2021-02-24 MED ORDER — TRAMADOL HCL 50 MG PO TABS: 50.0000 mg | ORAL_TABLET | Freq: Three times a day (TID) | ORAL | 0 refills | Status: DC | PRN

## 2021-02-24 NOTE — Telephone Encounter (Signed)
Sent tramadol for break through pain and flexeril for muscle relaxer.

## 2021-02-25 NOTE — Telephone Encounter (Signed)
Called patient to notify. Patient has picked up meds and has been taking them.

## 2021-04-15 NOTE — Progress Notes (Signed)
?HPI with pertinent ROS:  ? ?CC: transfer of care ? ?HPI: ?Pleasant 61 year old male presenting to transfer care to a new PCP and for the following: ? ?COPD- using albuterol inhaler every other day on average. Used to have a different inhaler (doesn't remember the name) but lost it when on vacation and never got it refilled. Notes it worked very well for his symptoms.  ? ?Tobacco use- smoking about 1ppd cigarettes on average although notes some days are more than others depending on his stress level. Has been working to cut down and plans to continue trying.  ? ?HLD- taking lipitor 20mg  daily, tolerating well without side effects.  ? ?HTN- taking lisinopril 10mg  daily, tolerating well without side effects. No regularly checking BP at home but when he does, reports it's lower than it was here today.  ? ?GERD- taking Prilosec 20mg  daily, tolerating well without side effects. Notes it works most of the time to control his symptoms.  ? ?Mood- living in chronic pain and has a stressful job. Taking Wellbutrin 300mg  daily, tolerating well without side effects. Feels the medication helps, sometimes better than others.  ? ?Back pain- was recently evaluated for low back pain on the left side. X-ray showed significant degeneration at L4-5 and L5-S1. He is currently taking Ibuprofen 800mg  twice a day but it doesn't help much. Was given home exercises to do. Formal PT is not financially feasible with no current insurance coverage.  ? ?Knee pain- has had bilateral knee pain for a while. Worse when walking down stairs or downhill. Pain occurs along the top of the knee and just below the kneecap on both sides. No popping, clicking, or locking.  ? ?I reviewed the past medical history, family history, social history, surgical history, and allergies today and no changes were needed.  Please see the problem list section below in epic for further details. ? ? ?Physical exam:  ? ?General: Well Developed, well nourished, and in no acute  distress.  ?Neuro: Alert and oriented x3.  ?HEENT: Normocephalic, atraumatic.  ?Skin: Warm and dry. ?Cardiac: Regular rate and rhythm, no murmurs rubs or gallops, no lower extremity edema.  ?Respiratory: Clear to auscultation bilaterally. Not using accessory muscles, speaking in full sentences. ? ?Impression and Recommendations:   ? ?1. Encounter to establish care ?Reviewed available information and discussed care concerns with patient.  ? ?2. Stage 2 moderate COPD by GOLD classification (HCC) ?Reviewed records for the previous inhaler. Looks like he was on Symbicort so resending this to his pharmacy. Continue Albuterol prn.  ? ?3. Tobacco use disorder, continuous ?Recommend continuing to cut back with a goal for cessation.  ? ?4. Mixed hyperlipidemia ?Continue Lipitor 20mg  daily.  ? ?5. Chronic pain of both knees ?6. Lumbar degenerative disc disease ?Likely all related to arthritic/degenerative changes. Recommend anti-inflammatories and home exercises since formal PT is not an option. Since he doesn't tolerate Mobic (nausea/vomiting), switching to Voltaren 75mg  BID prn. Encouraged home exercises for strengthening.  ? ?7. Gastroesophageal reflux disease without esophagitis ?Continue prilosec 20mg  daily prn.  ? ?8. Primary hypertension ?BP slightly elevated today but at goal at home. Continue Lisinopril 10mg  daily.  ? ?9. Major depressive disorder with single episode, in partial remission (HCC) ?Continue Wellbutrin 300mg  daily. Would benefit from counseling but this is not financially feasible. Consider adding a SSRI/SNRI for further benefit to mood and generalized pain.  ? ?Return in about 6 months (around 10/16/2021) for chronic disease follow up. ?___________________________________________ ? , DNP, APRN,  FNP-BC ?Primary Care and Sports Medicine ?[facility] ?

## 2021-04-16 ENCOUNTER — Encounter: Payer: Self-pay | Admitting: Medical-Surgical

## 2021-04-16 ENCOUNTER — Ambulatory Visit (INDEPENDENT_AMBULATORY_CARE_PROVIDER_SITE_OTHER): Payer: Self-pay | Admitting: Medical-Surgical

## 2021-04-16 VITALS — BP 135/90 | HR 103 | Resp 20 | Ht 72.0 in | Wt 298.7 lb

## 2021-04-16 DIAGNOSIS — M25562 Pain in left knee: Secondary | ICD-10-CM

## 2021-04-16 DIAGNOSIS — I10 Essential (primary) hypertension: Secondary | ICD-10-CM

## 2021-04-16 DIAGNOSIS — M5136 Other intervertebral disc degeneration, lumbar region: Secondary | ICD-10-CM

## 2021-04-16 DIAGNOSIS — F17209 Nicotine dependence, unspecified, with unspecified nicotine-induced disorders: Secondary | ICD-10-CM

## 2021-04-16 DIAGNOSIS — Z7689 Persons encountering health services in other specified circumstances: Secondary | ICD-10-CM

## 2021-04-16 DIAGNOSIS — E782 Mixed hyperlipidemia: Secondary | ICD-10-CM

## 2021-04-16 DIAGNOSIS — G8929 Other chronic pain: Secondary | ICD-10-CM

## 2021-04-16 DIAGNOSIS — K219 Gastro-esophageal reflux disease without esophagitis: Secondary | ICD-10-CM

## 2021-04-16 DIAGNOSIS — J449 Chronic obstructive pulmonary disease, unspecified: Secondary | ICD-10-CM

## 2021-04-16 DIAGNOSIS — F324 Major depressive disorder, single episode, in partial remission: Secondary | ICD-10-CM

## 2021-04-16 DIAGNOSIS — M25561 Pain in right knee: Secondary | ICD-10-CM

## 2021-04-16 MED ORDER — BUDESONIDE-FORMOTEROL FUMARATE 160-4.5 MCG/ACT IN AERO: 2.0000 | INHALATION_SPRAY | Freq: Two times a day (BID) | RESPIRATORY_TRACT | 3 refills | Status: DC

## 2021-04-16 MED ORDER — DICLOFENAC SODIUM 75 MG PO TBEC: 75.0000 mg | DELAYED_RELEASE_TABLET | Freq: Two times a day (BID) | ORAL | 3 refills | Status: DC

## 2021-04-16 NOTE — Patient Instructions (Signed)
Start Voltaren 75mg  twice daily. STOP MOBIC. STOP IBUPROFEN PRODUCTS IF TAKING VOLTAREN.  ? ?Do regular exercises for low back pain.  ? ? ?

## 2021-07-16 ENCOUNTER — Telehealth: Payer: Self-pay | Admitting: Neurology

## 2021-07-16 NOTE — Telephone Encounter (Signed)
Patient left vm on my line stating he is having COPD issues.   Please call and schedule for appt in office for evaluation. Joy patient.

## 2021-07-17 NOTE — Telephone Encounter (Signed)
Patient stated he wasn't having COPD issues, that his nose was stopped up/runny and he overall didn't feel well, possible bronchitis patient said. Patient wanted to see Lesly Rubenstein, or PCP and I let patient know we don't have any appointment slots open today, but he can go to urgent care or schedule a virtual visit (e-visit) through his MyChart.

## 2021-07-20 ENCOUNTER — Telehealth: Payer: Self-pay | Admitting: Physician Assistant

## 2021-07-20 DIAGNOSIS — J069 Acute upper respiratory infection, unspecified: Secondary | ICD-10-CM

## 2021-07-20 DIAGNOSIS — J441 Chronic obstructive pulmonary disease with (acute) exacerbation: Secondary | ICD-10-CM

## 2021-07-20 MED ORDER — BENZONATATE 100 MG PO CAPS: 100.0000 mg | ORAL_CAPSULE | Freq: Three times a day (TID) | ORAL | 0 refills | Status: DC | PRN

## 2021-07-20 MED ORDER — PREDNISONE 20 MG PO TABS: 40.0000 mg | ORAL_TABLET | Freq: Every day | ORAL | 0 refills | Status: DC

## 2021-07-20 NOTE — Progress Notes (Signed)
I have spent 5 minutes in review of e-visit questionnaire, review and updating patient chart, medical decision making and response to patient.   Ashira Kirsten Cody Nickoles Gregori, PA-C    

## 2021-07-20 NOTE — Progress Notes (Signed)
We are sorry that you are not feeling well.  Here is how we plan to help!  Based on your presentation I believe you most likely have A cough due to a virus.  This is called viral bronchitis and is best treated by rest, plenty of fluids and control of the cough.  You may use Ibuprofen or Tylenol as directed to help your symptoms.     In addition you may use A prescription cough medication called Tessalon Perles 100mg . You may take 1-2 capsules every 8 hours as needed for your cough.  Giving your history with COPD, I am adding on a 5-day burst of prednisone to help control cough and open the airways. For any non-resolving, new or worsening symptoms despite treatment, you will need an in-person evaluation.   From your responses in the eVisit questionnaire you describe inflammation in the upper respiratory tract which is causing a significant cough.  This is commonly called Bronchitis and has four common causes:   Allergies Viral Infections Acid Reflux Bacterial Infection Allergies, viruses and acid reflux are treated by controlling symptoms or eliminating the cause. An example might be a cough caused by taking certain blood pressure medications. You stop the cough by changing the medication. Another example might be a cough caused by acid reflux. Controlling the reflux helps control the cough.  USE OF BRONCHODILATOR ("RESCUE") INHALERS: There is a risk from using your bronchodilator too frequently.  The risk is that over-reliance on a medication which only relaxes the muscles surrounding the breathing tubes can reduce the effectiveness of medications prescribed to reduce swelling and congestion of the tubes themselves.  Although you feel brief relief from the bronchodilator inhaler, your asthma may actually be worsening with the tubes becoming more swollen and filled with mucus.  This can delay other crucial treatments, such as oral steroid medications. If you need to use a bronchodilator inhaler daily,  several times per day, you should discuss this with your provider.  There are probably better treatments that could be used to keep your asthma under control.     HOME CARE Only take medications as instructed by your medical team. Complete the entire course of an antibiotic. Drink plenty of fluids and get plenty of rest. Avoid close contacts especially the very young and the elderly Cover your mouth if you cough or cough into your sleeve. Always remember to wash your hands A steam or ultrasonic humidifier can help congestion.   GET HELP RIGHT AWAY IF: You develop worsening fever. You become short of breath You cough up blood. Your symptoms persist after you have completed your treatment plan MAKE SURE YOU  Understand these instructions. Will watch your condition. Will get help right away if you are not doing well or get worse.    Thank you for choosing an e-visit.  Your e-visit answers were reviewed by a board certified advanced clinical practitioner to complete your personal care plan. Depending upon the condition, your plan could have included both over the counter or prescription medications.  Please review your pharmacy choice. Make sure the pharmacy is open so you can pick up prescription now. If there is a problem, you may contact your provider through and have the prescription routed to another pharmacy.  Your safety is important to [employer]. If you have drug allergies check your prescription carefully.   For the next 24 hours you can use MyChart to ask questions about today's visit, request a non-urgent call back, or ask  for a work or school excuse. You will get an email in the next two days asking about your experience. I hope that your e-visit has been valuable and will speed your recovery.

## 2021-07-29 NOTE — Progress Notes (Unsigned)
   Acute Office Visit  Subjective:     Patient ID: Eric Baird, male    DOB: [birth year], 61 y.o.   MRN: [id number]  No chief complaint on file.   Pt presents with pharyngitis     ROS      Objective:    There were no vitals taken for this visit. {Vitals History (Optional):23777}  Physical Exam  No results found for any visits on 07/30/21.      Assessment & Plan:   Problem List Items Addressed This Visit   None   No orders of the defined types were placed in this encounter.   No follow-ups on file.  Charlton Amor, DO

## 2021-07-30 ENCOUNTER — Encounter: Payer: Self-pay | Admitting: Family Medicine

## 2021-07-30 ENCOUNTER — Ambulatory Visit (INDEPENDENT_AMBULATORY_CARE_PROVIDER_SITE_OTHER): Payer: Self-pay | Admitting: Family Medicine

## 2021-07-30 VITALS — BP 126/86 | HR 84 | Resp 97 | Ht 72.0 in | Wt 293.0 lb

## 2021-07-30 DIAGNOSIS — J209 Acute bronchitis, unspecified: Secondary | ICD-10-CM

## 2021-07-30 DIAGNOSIS — J440 Chronic obstructive pulmonary disease with acute lower respiratory infection: Secondary | ICD-10-CM

## 2021-07-30 HISTORY — DX: Acute bronchitis, unspecified: J20.9

## 2021-07-30 MED ORDER — DOXYCYCLINE HYCLATE 100 MG PO TABS: 100.0000 mg | ORAL_TABLET | Freq: Two times a day (BID) | ORAL | 0 refills | Status: AC

## 2021-07-30 NOTE — Patient Instructions (Addendum)
Increase Vit C, D and zinc with emergen-C or zinc  Start using your symbicort daily  Take daily allergy medication such as zyrtec  Use flonase (cross treatment)

## 2021-07-30 NOTE — Assessment & Plan Note (Addendum)
-   pt has hx of copd with some wheezing in RLQ and has had symptoms for 3 weeks will go ahead and treat with doxycycline for acute bronchitis  - non-compliant with symbicort inhaler-advised pt to take inhaler daily   - provided handout with information on bronchitis - encouraged rest, sleep, fluids, and vit c/d/zinc to boost immune system - take daily allergy medicine and flonase for fluid behind ears - follow up in 2 weeks for treatment

## 2021-07-31 ENCOUNTER — Telehealth: Payer: Self-pay | Admitting: Medical-Surgical

## 2021-08-04 DIAGNOSIS — H938X9 Other specified disorders of ear, unspecified ear: Secondary | ICD-10-CM | POA: Insufficient documentation

## 2021-09-21 ENCOUNTER — Other Ambulatory Visit: Payer: Self-pay | Admitting: Osteopathic Medicine

## 2021-09-21 DIAGNOSIS — I10 Essential (primary) hypertension: Secondary | ICD-10-CM

## 2021-09-21 DIAGNOSIS — F17200 Nicotine dependence, unspecified, uncomplicated: Secondary | ICD-10-CM

## 2021-09-21 DIAGNOSIS — E782 Mixed hyperlipidemia: Secondary | ICD-10-CM

## 2021-10-18 NOTE — Progress Notes (Unsigned)
   Established Patient Office Visit  Subjective   Patient ID: Stclair Szymborski, male   DOB: Apr 02, 1960 Age: 60 y.o. MRN: [id number]   No chief complaint on file.   HPI Pleasant 61 year old male presenting today for the following:  HTN:  COPD/tobacco use disorder:  HLD:     Objective:    There were no vitals filed for this visit.  Physical Exam   No results found for this or any previous visit (from the past 24 hour(s)).   {Labs (Optional):23779}  The 10-year ASCVD risk score (Arnett DK, et al., 2019) is: 17.7%   Values used to calculate the score:     Age: 73 years     Sex: Male     Is Non-Hispanic African American: No     Diabetic: No     Tobacco smoker: Yes     Systolic Blood Pressure: 188 mmHg     Is BP treated: Yes     HDL Cholesterol: 38 mg/dL     Total Cholesterol: 158 mg/dL   Assessment & Plan:   No problem-specific Assessment & Plan notes found for this encounter.   No follow-ups on file.  ___________________________________________ Clearnce Sorrel, DNP, APRN, FNP-BC Primary Care and [place]

## 2021-10-19 ENCOUNTER — Ambulatory Visit (INDEPENDENT_AMBULATORY_CARE_PROVIDER_SITE_OTHER): Payer: Self-pay | Admitting: Medical-Surgical

## 2021-10-19 ENCOUNTER — Encounter: Payer: Self-pay | Admitting: Medical-Surgical

## 2021-10-19 VITALS — BP 126/75 | HR 113 | Resp 20 | Ht 72.0 in | Wt 297.5 lb

## 2021-10-19 DIAGNOSIS — J449 Chronic obstructive pulmonary disease, unspecified: Secondary | ICD-10-CM

## 2021-10-19 DIAGNOSIS — Z23 Encounter for immunization: Secondary | ICD-10-CM

## 2021-10-19 DIAGNOSIS — I10 Essential (primary) hypertension: Secondary | ICD-10-CM

## 2021-10-19 DIAGNOSIS — Z2821 Immunization not carried out because of patient refusal: Secondary | ICD-10-CM

## 2021-10-19 DIAGNOSIS — F324 Major depressive disorder, single episode, in partial remission: Secondary | ICD-10-CM

## 2021-10-19 DIAGNOSIS — F17209 Nicotine dependence, unspecified, with unspecified nicotine-induced disorders: Secondary | ICD-10-CM

## 2021-10-19 DIAGNOSIS — Z1211 Encounter for screening for malignant neoplasm of colon: Secondary | ICD-10-CM

## 2021-10-19 DIAGNOSIS — E782 Mixed hyperlipidemia: Secondary | ICD-10-CM

## 2021-10-19 MED ORDER — TRAZODONE HCL 50 MG PO TABS: 50.0000 mg | ORAL_TABLET | Freq: Every day | ORAL | 1 refills | Status: AC

## 2021-10-19 MED ORDER — MONTELUKAST SODIUM 10 MG PO TABS: 10.0000 mg | ORAL_TABLET | Freq: Every day | ORAL | 0 refills | Status: DC

## 2021-11-09 ENCOUNTER — Encounter: Payer: Self-pay | Admitting: Medical-Surgical

## 2021-11-23 ENCOUNTER — Telehealth (INDEPENDENT_AMBULATORY_CARE_PROVIDER_SITE_OTHER): Payer: Self-pay | Admitting: Medical-Surgical

## 2021-11-23 ENCOUNTER — Encounter: Payer: Self-pay | Admitting: Medical-Surgical

## 2021-11-23 DIAGNOSIS — J441 Chronic obstructive pulmonary disease with (acute) exacerbation: Secondary | ICD-10-CM

## 2021-11-23 DIAGNOSIS — J329 Chronic sinusitis, unspecified: Secondary | ICD-10-CM

## 2021-11-23 DIAGNOSIS — J40 Bronchitis, not specified as acute or chronic: Secondary | ICD-10-CM

## 2021-11-23 MED ORDER — PREDNISONE 50 MG PO TABS: 50.0000 mg | ORAL_TABLET | Freq: Every day | ORAL | 0 refills | Status: DC

## 2021-11-23 MED ORDER — AZITHROMYCIN 250 MG PO TABS: ORAL_TABLET | ORAL | 0 refills | Status: AC

## 2021-11-23 MED ORDER — BENZONATATE 200 MG PO CAPS: 200.0000 mg | ORAL_CAPSULE | Freq: Three times a day (TID) | ORAL | 0 refills | Status: DC | PRN

## 2021-11-23 NOTE — Progress Notes (Signed)
Since Thursday, chest and sinus congestion, coughing, sneezing. Back is sore from coughing.  COVID test at home was negative. Tried OTC mucinex, cough syrup which are not helping.

## 2021-11-23 NOTE — Progress Notes (Signed)
Virtual Visit via Video Note  I connected with Eric Baird on 11/23/21 at 11:10 AM EST by a video enabled telemedicine application and verified that I am speaking with the correct person using two identifiers.   I discussed the limitations of evaluation and management by telemedicine and the availability of in person appointments. The patient expressed understanding and agreed to proceed.  Patient location: home Provider locations: office  Subjective:    CC: Cough, congestion  HPI: Pleasant 61 year old male presenting via MyChart video visit with reports of 5 days of increased cough, sinus congestion, and chest congestion.  Reports that his cough is productive of large amounts of greenish-brown to clear mucus.  Went to [employer] last week and his symptoms started on Thursday.  He is using his albuterol and Symbicort inhalers as prescribed.  Taking Singulair 10 mg nightly as prescribed.  Tested for COVID approximately 3 days ago with negative result.  Denies fever, chills, GI symptoms, chest pain, and worsening shortness of breath.  Past medical history, Surgical history, Family history not pertinant except as noted below, Social history, Allergies, and medications have been entered into the medical record, reviewed, and corrections made.   Review of Systems: See HPI for pertinent positives and negatives.   Objective:    General: Speaking clearly in complete sentences without any shortness of breath.  Alert and oriented x3.  Normal judgment. No apparent acute distress.  Impression and Recommendations:    1. Sinobronchitis 2. COPD exacerbation (HCC) 5 days of symptoms with increased mucus production.  Treating with azithromycin, prednisone 5-day burst, Tessalon Perles every 8 hours as needed.  Continue conservative measures at home.  I discussed the assessment and treatment plan with the patient. The patient was provided an opportunity to ask questions and all were answered. The patient  agreed with the plan and demonstrated an understanding of the instructions.   The patient was advised to call back or seek an in-person evaluation if the symptoms worsen or if the condition fails to improve as anticipated.  25 minutes of non-face-to-face time was provided during this encounter.  Return if symptoms worsen or fail to improve.  [name], DNP, APRN, FNP-BC  [facility]

## 2021-12-11 ENCOUNTER — Ambulatory Visit (INDEPENDENT_AMBULATORY_CARE_PROVIDER_SITE_OTHER): Payer: Self-pay

## 2021-12-11 ENCOUNTER — Ambulatory Visit (INDEPENDENT_AMBULATORY_CARE_PROVIDER_SITE_OTHER): Payer: Self-pay | Admitting: Medical-Surgical

## 2021-12-11 ENCOUNTER — Encounter: Payer: Self-pay | Admitting: Medical-Surgical

## 2021-12-11 VITALS — BP 121/78 | HR 96 | Resp 20 | Ht 72.0 in | Wt 296.9 lb

## 2021-12-11 DIAGNOSIS — M25532 Pain in left wrist: Secondary | ICD-10-CM

## 2021-12-11 NOTE — Progress Notes (Signed)
Established Patient Office Visit  Subjective   Patient ID: Eric Baird, male   DOB: 09-25-1960 Age: 61 y.o. MRN: [id number]   Chief Complaint  Patient presents with   Joint Swelling    Knot on left wrist    HPI Pleasant 61 year old male presenting today for evaluation of left wrist swelling/tenderness.  A couple of days ago, he noted that he had a small swollen area on the radial side of the left wrist just over the radial artery.  This spot did not resolve and yesterday he noted it had gotten bigger.  Today is even larger.  The area is tender to touch but otherwise does not hurt.  Notes that his hand feels swollen.  No known injury to the area no prior surgeries.  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   Objective:    Vitals:   12/11/21 1314  BP: 121/78  Pulse: 96  Resp: 20  Height: 6' (1.829 m)  Weight: 296 lb 14.4 oz (134.7 kg)  SpO2: 95%  BMI (Calculated): 40.26    Physical Exam Vitals reviewed.  Constitutional:      General: He is not in acute distress.    Appearance: Normal appearance. He is obese. He is not ill-appearing.  HENT:     Head: Normocephalic.  Cardiovascular:     Rate and Rhythm: Normal rate.     Pulses: Normal pulses.     Heart sounds: Normal heart sounds. No murmur heard.    No friction rub. No gallop.  Pulmonary:     Effort: Pulmonary effort is normal. No respiratory distress.     Breath sounds: Normal breath sounds.  Musculoskeletal:        General: Tenderness (Left inner wrist on the right side lower area of swelling (3-4 cm round, 1-2 cm high)) present.  Skin:    General: Skin is warm and dry.  Neurological:     Mental Status: He is alert and oriented to person, place, and time.  Psychiatric:        Mood and Affect: Mood normal.        Behavior: Behavior normal.        Thought Content: Thought content normal.        Judgment: Judgment normal.   No results found for this or any previous visit  (from the past 24 hour(s)).     The 10-year ASCVD risk score (Arnett DK, et al., 2019) is: 16.6%   Values used to calculate the score:     Age: 9 years     Sex: Male     Is Non-Hispanic African American: No     Diabetic: No     Tobacco smoker: Yes     Systolic Blood Pressure: 121 mmHg     Is BP treated: Yes     HDL Cholesterol: 38 mg/dL     Total Cholesterol: 158 mg/dL   Assessment & Plan:   1. Left wrist pain Getting x-rays today.  No recent injury to provide insight into the injury.  On exam, this does appear to be consistent with a ganglion cyst.  Due to the size and the location near major blood vessels, uncomfortable with drainage today.  Would like him to return to see Dr. Benjamin Stain for possible drainage of the ganglion cyst.  Warned patient that they do tend to be stubborn and often recur.  Recommend anti-inflammatories, ice/heat, and compression.  Ace wrap provided  for patient to wrap to his comfort once his x-rays have been completed. - DG Wrist Complete Left; Future  Return for left wrist ganglion cyst with Dr. Darene Lamer at the earliest convenience. ___________________________________________ Clearnce Sorrel, DNP, APRN, FNP-BC [facility]

## 2021-12-14 ENCOUNTER — Encounter: Payer: Self-pay | Admitting: Medical-Surgical

## 2021-12-21 ENCOUNTER — Ambulatory Visit: Payer: Self-pay | Admitting: Sports Medicine

## 2021-12-23 ENCOUNTER — Other Ambulatory Visit: Payer: Self-pay | Admitting: Medical-Surgical

## 2021-12-23 DIAGNOSIS — E782 Mixed hyperlipidemia: Secondary | ICD-10-CM

## 2021-12-23 DIAGNOSIS — I10 Essential (primary) hypertension: Secondary | ICD-10-CM

## 2021-12-23 DIAGNOSIS — F17200 Nicotine dependence, unspecified, uncomplicated: Secondary | ICD-10-CM

## 2022-03-19 ENCOUNTER — Other Ambulatory Visit: Payer: Self-pay | Admitting: Medical-Surgical

## 2022-03-19 DIAGNOSIS — F17200 Nicotine dependence, unspecified, uncomplicated: Secondary | ICD-10-CM

## 2022-03-19 DIAGNOSIS — E782 Mixed hyperlipidemia: Secondary | ICD-10-CM

## 2022-03-19 DIAGNOSIS — I10 Essential (primary) hypertension: Secondary | ICD-10-CM

## 2022-04-22 ENCOUNTER — Ambulatory Visit (INDEPENDENT_AMBULATORY_CARE_PROVIDER_SITE_OTHER): Payer: Self-pay | Admitting: Medical-Surgical

## 2022-04-22 ENCOUNTER — Encounter: Payer: Self-pay | Admitting: Medical-Surgical

## 2022-04-22 VITALS — BP 116/78 | HR 97 | Resp 20 | Ht 72.0 in | Wt 295.1 lb

## 2022-04-22 DIAGNOSIS — Z716 Tobacco abuse counseling: Secondary | ICD-10-CM

## 2022-04-22 DIAGNOSIS — J449 Chronic obstructive pulmonary disease, unspecified: Secondary | ICD-10-CM

## 2022-04-22 DIAGNOSIS — I10 Essential (primary) hypertension: Secondary | ICD-10-CM

## 2022-04-22 DIAGNOSIS — E782 Mixed hyperlipidemia: Secondary | ICD-10-CM

## 2022-04-22 DIAGNOSIS — F17200 Nicotine dependence, unspecified, uncomplicated: Secondary | ICD-10-CM

## 2022-04-22 MED ORDER — ATORVASTATIN CALCIUM 20 MG PO TABS: 20.0000 mg | ORAL_TABLET | Freq: Every day | ORAL | 3 refills | Status: DC

## 2022-04-22 MED ORDER — MONTELUKAST SODIUM 10 MG PO TABS: 10.0000 mg | ORAL_TABLET | Freq: Every day | ORAL | 3 refills | Status: DC

## 2022-04-22 MED ORDER — OMEPRAZOLE 20 MG PO CPDR: 20.0000 mg | DELAYED_RELEASE_CAPSULE | Freq: Every day | ORAL | 3 refills | Status: DC

## 2022-04-22 MED ORDER — BUPROPION HCL ER (XL) 300 MG PO TB24
300.0000 mg | ORAL_TABLET | Freq: Every day | ORAL | 3 refills | Status: DC
Start: 2022-04-22 — End: 2023-04-29

## 2022-04-22 MED ORDER — LISINOPRIL 10 MG PO TABS
10.0000 mg | ORAL_TABLET | Freq: Every day | ORAL | 3 refills | Status: DC
Start: 2022-04-22 — End: 2023-04-29

## 2022-04-22 MED ORDER — BUDESONIDE-FORMOTEROL FUMARATE 160-4.5 MCG/ACT IN AERO: 2.0000 | INHALATION_SPRAY | Freq: Two times a day (BID) | RESPIRATORY_TRACT | 3 refills | Status: DC

## 2022-04-22 MED ORDER — ALBUTEROL SULFATE HFA 108 (90 BASE) MCG/ACT IN AERS: 1.0000 | INHALATION_SPRAY | RESPIRATORY_TRACT | 99 refills | Status: DC | PRN

## 2022-04-22 NOTE — Progress Notes (Signed)
        Established patient visit  History, exam, impression, and plan:  Essential hypertension Pleasant 62 year old male with a history of hypertension presenting today for follow-up.  Currently taking lisinopril 10 mg daily, tolerating well without side effects.  Not monitoring blood pressures at home.  Works to follow a low-sodium diet.  Has a physically demanding job but does not exercise outside of that.  Endorses some dyspnea on exertion but no worse than his typical symptoms given associated COPD.  Denies chest pain, HA, LE edema, palpitations, dizziness, and unusual headaches.  On exam, HRR, S1/S2 normal.  Lungs with scattered expiratory wheezing throughout.  Respirations even and unlabored.  Blood pressure at goal today.  Continue lisinopril as prescribed.  No labs checked in the last couple of years.  Reiterated that it is very important that we monitor electrolytes and kidney function when taking blood pressure medications.  After discussion, we will obtain a BMP today.  Stage 2 moderate COPD by GOLD classification (HCC) History of COPD with continued tobacco use.  Currently taking Singulair 10 mg daily, Symbicort 2 puffs twice daily, and albuterol 1-2 puffs every 4 hours as needed.  Does have some increased difficulty with breathing with the increase in pollen and environmental allergen exposure.  Feels that his symptoms are fairly well-managed on the above regimen.  Reports that he has been ready to quit smoking for a while however quotes that it is "not that easy" and he has not been able to do so.  He has never tried Chantix due to affordability and lack of insurance.  Aware of resources for tobacco cessation.  Continue medications as prescribed.  Mixed hyperlipidemia Taking Lipitor 20 mg daily, tolerating well without side effects.  Following a low-fat heart healthy diet most of the time.  Has not had labs checked in a couple of years due to financial difficulties.  Aware that this needs  to be done sometime soon for safe prescribing and monitoring of dosing.  Tobacco dependence Currently taking Wellbutrin to assist with smoking cessation but he has not been able to completely quit yet.  Aware of recommendations for smoking cessation and resources that are available to do so.  Unable to afford Chantix at this time given his uninsured status.   Procedures performed this visit: None.  Return in about 6 months (around 10/22/2022) for chronic disease follow up.  __________________________________ [name], DNP, APRN, FNP-BC Primary Care and Sports Medicine [facility] [place]

## 2022-04-22 NOTE — Assessment & Plan Note (Signed)
History of COPD with continued tobacco use.  Currently taking Singulair 10 mg daily, Symbicort 2 puffs twice daily, and albuterol 1-2 puffs every 4 hours as needed.  Does have some increased difficulty with breathing with the increase in pollen and environmental allergen exposure.  Feels that his symptoms are fairly well-managed on the above regimen.  Reports that he has been ready to quit smoking for a while however quotes that it is "not that easy" and he has not been able to do so.  He has never tried Chantix due to affordability and lack of insurance.  Aware of resources for tobacco cessation.  Continue medications as prescribed.

## 2022-04-22 NOTE — Assessment & Plan Note (Signed)
Currently taking Wellbutrin to assist with smoking cessation but he has not been able to completely quit yet.  Aware of recommendations for smoking cessation and resources that are available to do so.  Unable to afford Chantix at this time given his uninsured status.

## 2022-04-22 NOTE — Assessment & Plan Note (Addendum)
Pleasant 62 year old male with a history of hypertension presenting today for follow-up.  Currently taking lisinopril 10 mg daily, tolerating well without side effects.  Not monitoring blood pressures at home.  Works to follow a low-sodium diet.  Has a physically demanding job but does not exercise outside of that.  Endorses some dyspnea on exertion but no worse than his typical symptoms given associated COPD.  Denies chest pain, HA, LE edema, palpitations, dizziness, and unusual headaches.  On exam, HRR, S1/S2 normal.  Lungs with scattered expiratory wheezing throughout.  Respirations even and unlabored.  Blood pressure at goal today.  Continue lisinopril as prescribed.  No labs checked in the last couple of years.  Reiterated that it is very important that we monitor electrolytes and kidney function when taking blood pressure medications.  After discussion, we will obtain a BMP today.

## 2022-04-22 NOTE — Assessment & Plan Note (Deleted)
Reviewed recommendations for smoking cessation.  As noted, he reports that he has been ready to quit smoking for a while but has been unable to successfully do so.  Aware of resources available to help with smoking cessation.  Cost is a significant concern given his uninsured status.

## 2022-04-22 NOTE — Assessment & Plan Note (Signed)
Taking Lipitor 20 mg daily, tolerating well without side effects.  Following a low-fat heart healthy diet most of the time.  Has not had labs checked in a couple of years due to financial difficulties.  Aware that this needs to be done sometime soon for safe prescribing and monitoring of dosing.

## 2022-04-23 LAB — BASIC METABOLIC PANEL WITH GFR
BUN: 17 mg/dL (ref 7–25)
CO2: 26 mmol/L (ref 20–32)
Calcium: 9.7 mg/dL (ref 8.6–10.3)
Chloride: 103 mmol/L (ref 98–110)
Creat: 1.21 mg/dL (ref 0.70–1.35)
Glucose, Bld: 76 mg/dL (ref 65–99)
Potassium: 4.4 mmol/L (ref 3.5–5.3)
Sodium: 138 mmol/L (ref 135–146)
eGFR: 67 mL/min/{1.73_m2} (ref >=60)

## 2022-04-23 LAB — SPECIMEN COMPROMISED

## 2022-07-05 ENCOUNTER — Ambulatory Visit (INDEPENDENT_AMBULATORY_CARE_PROVIDER_SITE_OTHER): Payer: Self-pay

## 2022-07-05 ENCOUNTER — Encounter: Payer: Self-pay | Admitting: Medical-Surgical

## 2022-07-05 ENCOUNTER — Ambulatory Visit (INDEPENDENT_AMBULATORY_CARE_PROVIDER_SITE_OTHER): Payer: Self-pay | Admitting: Medical-Surgical

## 2022-07-05 VITALS — BP 146/87 | HR 108 | Ht 72.0 in | Wt 287.0 lb

## 2022-07-05 DIAGNOSIS — R0602 Shortness of breath: Secondary | ICD-10-CM

## 2022-07-05 DIAGNOSIS — R051 Acute cough: Secondary | ICD-10-CM

## 2022-07-05 DIAGNOSIS — J441 Chronic obstructive pulmonary disease with (acute) exacerbation: Secondary | ICD-10-CM

## 2022-07-05 DIAGNOSIS — R059 Cough, unspecified: Secondary | ICD-10-CM

## 2022-07-05 MED ORDER — AIRSUPRA 90-80 MCG/ACT IN AERO: 2.0000 | INHALATION_SPRAY | RESPIRATORY_TRACT | 11 refills | Status: DC | PRN

## 2022-07-05 MED ORDER — PREDNISONE 50 MG PO TABS: 50.0000 mg | ORAL_TABLET | Freq: Every day | ORAL | 0 refills | Status: DC

## 2022-07-05 MED ORDER — AMOXICILLIN-POT CLAVULANATE 875-125 MG PO TABS: 1.0000 | ORAL_TABLET | Freq: Two times a day (BID) | ORAL | 0 refills | Status: DC

## 2022-07-05 MED ORDER — BENZONATATE 200 MG PO CAPS: 200.0000 mg | ORAL_CAPSULE | Freq: Three times a day (TID) | ORAL | 0 refills | Status: DC | PRN

## 2022-07-05 MED ORDER — HYDROCODONE BIT-HOMATROP MBR 5-1.5 MG/5ML PO SOLN: 5.0000 mL | Freq: Four times a day (QID) | ORAL | 0 refills | Status: DC | PRN

## 2022-07-05 NOTE — Progress Notes (Signed)
Established patient visit  History, exam, impression, and plan:  1. Acute cough 2. COPD exacerbation [facility]) Pleasant 62 year old male presenting today with reports of upper respiratory symptoms that started over the weekend.  Notes that he has had a fever off and on as well as mild chills.  Developed a severe pounding headache along with sinus pressure/congestion.  Has an increased cough with increased sputum production.  Reports that this is clear for now.  Coughing so hard that his chest and abdomen are sore.  Also notes that he is experiencing lightheadedness/dizziness with the severe cough.  Did have 1 episode of diarrhea this morning but denies nausea/vomiting.  Has been taking Mucinex at home which was not helpful.  He does work with the public and is not sure about exposure.  He did do a COVID test which was negative.  See below for physical exam.  Chest x-ray today.  Treating with Augmentin twice daily x 10 days.  Adding Tessalon Perles and Hycodan cough syrup as needed.  Start prednisone 50 mg daily x 5 days.  He has albuterol inhaler which she has been using however it has not seem to help much with his current symptoms.  Switching to Airsupra 2 puffs every 4 hours as needed.  Coupon card provided with instructions to take this to the pharmacy. - DG Chest 2 View; Future  Physical Exam Vitals and nursing note reviewed.  Constitutional:      General: He is not in acute distress.    Appearance: Normal appearance. He is ill-appearing.  HENT:     Head: Normocephalic and atraumatic.     Right Ear: Tympanic membrane, ear canal and external ear normal. There is no impacted cerumen.     Left Ear: Tympanic membrane, ear canal and external ear normal. There is no impacted cerumen.     Nose:     Right Sinus: Maxillary sinus tenderness and frontal sinus tenderness present.     Left Sinus: Maxillary sinus tenderness and frontal sinus tenderness present.     Mouth/Throat:     Mouth: Mucous  membranes are moist.     Pharynx: Posterior oropharyngeal erythema present.  Eyes:     General: No scleral icterus.       Right eye: No discharge.        Left eye: No discharge.     Extraocular Movements: Extraocular movements intact.     Conjunctiva/sclera: Conjunctivae normal.     Pupils: Pupils are equal, round, and reactive to light.  Cardiovascular:     Rate and Rhythm: Normal rate and regular rhythm.     Pulses: Normal pulses.     Heart sounds: Normal heart sounds. No murmur heard.    No friction rub. No gallop.  Pulmonary:     Effort: Respiratory distress present.     Breath sounds: Wheezing and rhonchi present.  Musculoskeletal:     Cervical back: Neck supple.  Lymphadenopathy:     Cervical: Cervical adenopathy present.  Skin:    General: Skin is warm and dry.  Neurological:     Mental Status: He is alert and oriented to person, place, and time.  Psychiatric:        Mood and Affect: Mood normal.        Behavior: Behavior normal.        Thought Content: Thought content normal.        Judgment: Judgment normal.   Procedures performed this visit: None.  Return if symptoms worsen or fail to improve.  __________________________________ [name], DNP, APRN, FNP-BC Primary Care and Sports Medicine [facility] [place]

## 2022-10-08 ENCOUNTER — Other Ambulatory Visit: Payer: Self-pay | Admitting: Medical-Surgical

## 2022-10-22 ENCOUNTER — Encounter: Payer: Self-pay | Admitting: Medical-Surgical

## 2022-10-22 ENCOUNTER — Ambulatory Visit (INDEPENDENT_AMBULATORY_CARE_PROVIDER_SITE_OTHER): Payer: Self-pay | Admitting: Medical-Surgical

## 2022-10-22 VITALS — BP 127/76 | HR 72 | Resp 20 | Ht 72.0 in | Wt 285.1 lb

## 2022-10-22 DIAGNOSIS — Z716 Tobacco abuse counseling: Secondary | ICD-10-CM

## 2022-10-22 DIAGNOSIS — M51369 Other intervertebral disc degeneration, lumbar region without mention of lumbar back pain or lower extremity pain: Secondary | ICD-10-CM

## 2022-10-22 DIAGNOSIS — E782 Mixed hyperlipidemia: Secondary | ICD-10-CM

## 2022-10-22 DIAGNOSIS — I10 Essential (primary) hypertension: Secondary | ICD-10-CM

## 2022-10-22 DIAGNOSIS — J449 Chronic obstructive pulmonary disease, unspecified: Secondary | ICD-10-CM

## 2022-10-22 MED ORDER — DICLOFENAC SODIUM 75 MG PO TBEC
75.0000 mg | DELAYED_RELEASE_TABLET | Freq: Two times a day (BID) | ORAL | 3 refills | Status: DC
Start: 2022-10-22 — End: 2023-04-29

## 2022-10-22 MED ORDER — HYDROCODONE BIT-HOMATROP MBR 5-1.5 MG/5ML PO SOLN: 5.0000 mL | Freq: Four times a day (QID) | ORAL | 0 refills | Status: DC | PRN

## 2022-10-22 NOTE — Progress Notes (Signed)
        Established patient visit  History, exam, impression, and plan:  1. Essential hypertension Pleasant 62 year old male presenting today with a history of hypertension currently treated with lisinopril 10 mg daily, tolerating well without side effects.  Not checking blood pressures at home.  Does not add salt to foods.  Has a physically active job but no outside exercise.  Denies any concerning symptoms today.  Cardiopulmonary exam benign.  Checking labs as below.  Blood pressure at goal.  Continue lisinopril as prescribed. - CBC - CMP14+EGFR - Lipid panel  2. Stage 2 moderate COPD by GOLD classification [facility]) He is a current smoker and notes that over the last couple of weeks, he has been extremely stressed therefore his smoking has increased.  He is aware of recommendations for smoking cessation.  Has stage II COPD and uses his inhalers sporadically.  No current complaints of increased shortness of breath or sputum production.  Lungs CTA with even unlabored respirations.  As above, recommend smoking cessation.  Continue inhaler use.  Recommend increasing Symbicort to regular use with only as needed albuterol.  Does have some issues with cough exacerbation due to environmental exposures with his job.  Refilling Hycodan cough syrup for very sparing use because of this.  3. Mixed hyperlipidemia Taking Lipitor 20 mg daily, tolerating well without side effects.  Checking lipids today.  Continue Lipitor.  4. Tobacco abuse counseling See above.  5. Degeneration of intervertebral disc of lumbar region, unspecified whether pain present Previously taking diclofenac 75 mg nightly to help with arthritis.  He ran out of this a few days ago.  Notes that it was helping with his arthritis issues and would like to have a refill.  Refilling diclofenac today for twice daily use as needed. - diclofenac (VOLTAREN) 75 MG EC tablet; Take 1 tablet (75 mg total) by mouth 2 (two) times daily. DO NOT TAKE  IBUPROFEN PRODUCTS WHILE ON THIS MEDICATION. STOP MOBIC.  Dispense: 60 tablet; Refill: 3    Procedures performed this visit: None.  No follow-ups on file.  __________________________________ [name], DNP, APRN, FNP-BC Primary Care and Sports Medicine [facility] [place]

## 2022-10-23 LAB — CMP14+EGFR
ALT: 20 [IU]/L (ref 0–44)
AST: 33 [IU]/L (ref 0–40)
Albumin: 4.5 g/dL (ref 3.9–4.9)
Alkaline Phosphatase: 77 [IU]/L (ref 44–121)
BUN/Creatinine Ratio: 13 (ref 10–24)
BUN: 14 mg/dL (ref 8–27)
Bilirubin Total: 0.5 mg/dL (ref 0.0–1.2)
CO2: 23 mmol/L (ref 20–29)
Calcium: 9.8 mg/dL (ref 8.6–10.2)
Chloride: 101 mmol/L (ref 96–106)
Creatinine, Ser: 1.11 mg/dL (ref 0.76–1.27)
Globulin, Total: 2.8 g/dL (ref 1.5–4.5)
Glucose: 69 mg/dL — ABNORMAL LOW (ref 70–99)
Potassium: 4.2 mmol/L (ref 3.5–5.2)
Sodium: 140 mmol/L (ref 134–144)
Total Protein: 7.3 g/dL (ref 6.0–8.5)
eGFR: 74 mL/min/{1.73_m2} (ref >59)

## 2022-10-23 LAB — LIPID PANEL
Chol/HDL Ratio: 4.4 {ratio} (ref 0.0–5.0)
Cholesterol, Total: 148 mg/dL (ref 100–199)
HDL: 34 mg/dL — ABNORMAL LOW (ref >39)
LDL Chol Calc (NIH): 91 mg/dL (ref 0–99)
Triglycerides: 128 mg/dL (ref 0–149)
VLDL Cholesterol Cal: 23 mg/dL (ref 5–40)

## 2022-10-23 LAB — CBC
Hematocrit: 48 % (ref 37.5–51.0)
Hemoglobin: 15.6 g/dL (ref 13.0–17.7)
MCH: 30.6 pg (ref 26.6–33.0)
MCHC: 32.5 g/dL (ref 31.5–35.7)
MCV: 94 fL (ref 79–97)
Platelets: 290 10*3/uL (ref 150–450)
RBC: 5.1 x10E6/uL (ref 4.14–5.80)
RDW: 13.2 % (ref 11.6–15.4)
WBC: 9.2 10*3/uL (ref 3.4–10.8)

## 2022-12-13 ENCOUNTER — Other Ambulatory Visit: Payer: Self-pay | Admitting: Medical-Surgical

## 2022-12-17 ENCOUNTER — Telehealth: Payer: Self-pay

## 2022-12-17 MED ORDER — BENZONATATE 200 MG PO CAPS: 200.0000 mg | ORAL_CAPSULE | Freq: Two times a day (BID) | ORAL | 3 refills | Status: DC | PRN

## 2022-12-17 NOTE — Telephone Encounter (Signed)
I called patient and his is not having trouble with a cough at this time. He just has a cough every once in a while. He thought Joy wanted him have them on hand just in case.

## 2022-12-17 NOTE — Telephone Encounter (Signed)
Refill sent for as needed use.

## 2022-12-17 NOTE — Telephone Encounter (Signed)
Copied from CRM [id number]. Topic: Clinical - Medication Question >> Dec 15, 2022 10:02 AM [name] wrote: Reason for CRM: Pt was calling to follow up on benzonatate (TESSALON) 200 MG capsule rx refill, pt states he is out and needs rx tonight, in pt chart it shows rx as discontinued, pt would like a callback

## 2023-04-22 ENCOUNTER — Ambulatory Visit: Payer: Self-pay | Admitting: Medical-Surgical

## 2023-04-29 ENCOUNTER — Ambulatory Visit: Payer: Self-pay | Admitting: Medical-Surgical

## 2023-04-29 ENCOUNTER — Encounter: Payer: Self-pay | Admitting: Medical-Surgical

## 2023-04-29 VITALS — BP 130/79 | HR 80 | Resp 20 | Ht 72.0 in | Wt 288.0 lb

## 2023-04-29 DIAGNOSIS — J449 Chronic obstructive pulmonary disease, unspecified: Secondary | ICD-10-CM

## 2023-04-29 DIAGNOSIS — I10 Essential (primary) hypertension: Secondary | ICD-10-CM

## 2023-04-29 DIAGNOSIS — E782 Mixed hyperlipidemia: Secondary | ICD-10-CM

## 2023-04-29 DIAGNOSIS — Z716 Tobacco abuse counseling: Secondary | ICD-10-CM

## 2023-04-29 DIAGNOSIS — M51369 Other intervertebral disc degeneration, lumbar region without mention of lumbar back pain or lower extremity pain: Secondary | ICD-10-CM

## 2023-04-29 DIAGNOSIS — F17200 Nicotine dependence, unspecified, uncomplicated: Secondary | ICD-10-CM

## 2023-04-29 MED ORDER — LISINOPRIL 20 MG PO TABS: 20.0000 mg | ORAL_TABLET | Freq: Every day | ORAL | 3 refills | Status: AC

## 2023-04-29 MED ORDER — ATORVASTATIN CALCIUM 20 MG PO TABS: 20.0000 mg | ORAL_TABLET | Freq: Every day | ORAL | 3 refills | Status: AC

## 2023-04-29 MED ORDER — OMEPRAZOLE 20 MG PO CPDR: 20.0000 mg | DELAYED_RELEASE_CAPSULE | Freq: Every day | ORAL | 3 refills | Status: AC

## 2023-04-29 MED ORDER — DICLOFENAC SODIUM 75 MG PO TBEC: 75.0000 mg | DELAYED_RELEASE_TABLET | Freq: Two times a day (BID) | ORAL | 3 refills | Status: AC

## 2023-04-29 MED ORDER — BUPROPION HCL ER (XL) 300 MG PO TB24: 300.0000 mg | ORAL_TABLET | Freq: Every day | ORAL | 3 refills | Status: AC

## 2023-04-29 NOTE — Progress Notes (Signed)
        Established patient visit  History, exam, impression, and plan:  1. Essential hypertension (Primary) Pleasant 63 year old male presenting today with a hx of HTN currently treated with Lisinopril  10mg  daily. Not checking BP at home. Aims for a low sodium diet although this can be hard at times depending on work. Continues smoking. Denies CP, SOB, palpitations, lower extremity edema, dizziness, headaches, or vision changes. Cardiopulmonary exam normal. BP elevated on arrival, better on recheck but borderline. Increasing Lisinopril  to 20mg  daily. Return in 2 weeks for NV for BP check.  - atorvastatin  (LIPITOR) 20 MG tablet; Take 1 tablet (20 mg total) by mouth daily.  Dispense: 90 tablet; Refill: 3 - lisinopril  (ZESTRIL ) 20 MG tablet; Take 1 tablet (20 mg total) by mouth daily.  Dispense: 90 tablet; Refill: 3  2. Stage 2 moderate COPD by GOLD classification (HCC) Using Symbicort  twice daily on an as needed basis. Has Airsupra  for a rescue inhaler. Feels that these work well for him. Mostly used during allergy season. Recommend smoking cessation. Continue Symbicort  but would prefer he use this on a regular basis rather than as needed. Continue prn Airsupra  for rescue.   3. Mixed hyperlipidemia Taking Lipitor 20mg  daily as prescribed. Tolerating well w/o SE. Following a low fat heart healthy diet. No concerning symptoms. UTD on lipid. Continue Lipitor as prescribed.  - atorvastatin  (LIPITOR) 20 MG tablet; Take 1 tablet (20 mg total) by mouth daily.  Dispense: 90 tablet; Refill: 3  4. Tobacco abuse counseling 5. Tobacco dependence Taking Wellbutrin  300mg  daily, tolerating well w/o side effects. Has not been able to quit smoking but is aware of recommendations for cessation. Working on cutting back the quantity of smoking and notes that he does well most of the time if he stays busy. If not busy, tends to smoke more. Continue working on cessation efforts.  - buPROPion  (WELLBUTRIN  XL) 300 MG 24  hr tablet; Take 1 tablet (300 mg total) by mouth daily.  Dispense: 90 tablet; Refill: 3  6. Degeneration of intervertebral disc of lumbar region, unspecified whether pain present Refilling diclofenac .  - diclofenac  (VOLTAREN ) 75 MG EC tablet; Take 1 tablet (75 mg total) by mouth 2 (two) times daily. DO NOT TAKE IBUPROFEN  PRODUCTS WHILE ON THIS MEDICATION. STOP MOBIC .  Dispense: 60 tablet; Refill: 3   Procedures performed this visit: None.  Return in about 2 weeks (around 05/13/2023) for nurse visit for BP check.  __________________________________ Maryl Snook, DNP, APRN, FNP-BC Primary Care and Sports Medicine [facility] [place]

## 2023-05-13 ENCOUNTER — Ambulatory Visit (INDEPENDENT_AMBULATORY_CARE_PROVIDER_SITE_OTHER): Payer: Self-pay

## 2023-05-13 VITALS — BP 116/77 | HR 95

## 2023-05-13 DIAGNOSIS — I10 Essential (primary) hypertension: Secondary | ICD-10-CM

## 2023-05-13 NOTE — Progress Notes (Signed)
   Subjective:    Patient ID: Eric Baird, male    DOB: [birth year], 63 y.o.   MRN: [id number]  HPI Patient is here for blood pressure check. Denies trouble sleeping, palpitations, dizziness, lightheadedness, blurry vision, chest pain, shortness of breath, headaches and/or medication problems.   Patient states that he feels a lot better after increasing his Lisinopril . Patient's blood pressure was within goal range.   Review of Systems     Objective:   Physical Exam        Assessment & Plan:  Provider notified of current blood pressure reading. Per provider, patient is to have labs done for CMP14+EGFR to check kidney and liver function after increasing dosage of the Lisinopril . I placed the lab orders and patient completed labs at nurse visit.

## 2023-05-14 LAB — CMP14+EGFR
ALT: 23 IU/L (ref 0–44)
AST: 63 IU/L — ABNORMAL HIGH (ref 0–40)
Albumin: 4.2 g/dL (ref 3.9–4.9)
Alkaline Phosphatase: 66 IU/L (ref 44–121)
BUN/Creatinine Ratio: 12 (ref 10–24)
BUN: 14 mg/dL (ref 8–27)
Bilirubin Total: 0.3 mg/dL (ref 0.0–1.2)
CO2: 19 mmol/L — ABNORMAL LOW (ref 20–29)
Calcium: 9.1 mg/dL (ref 8.6–10.2)
Chloride: 102 mmol/L (ref 96–106)
Creatinine, Ser: 1.15 mg/dL (ref 0.76–1.27)
Globulin, Total: 2.5 g/dL (ref 1.5–4.5)
Glucose: 76 mg/dL (ref 70–99)
Potassium: 4.7 mmol/L (ref 3.5–5.2)
Sodium: 137 mmol/L (ref 134–144)
Total Protein: 6.7 g/dL (ref 6.0–8.5)
eGFR: 71 mL/min/{1.73_m2} (ref >59)

## 2023-05-15 ENCOUNTER — Encounter: Payer: Self-pay | Admitting: Family Medicine

## 2023-05-15 NOTE — Progress Notes (Signed)
 Hi Brenton your AST liver enzyme is slightly elevated. I would recommend avoid alcohol and tyelnol products nd recheck in 3-4 weeks.

## 2023-06-26 IMAGING — DX DG LUMBAR SPINE COMPLETE 4+V
5 series · 5 of 5 positions shown · non-contrast
Comparison: None.

CLINICAL DATA: Atraumatic lower back pain x2 weeks.

EXAM:
LUMBAR SPINE - COMPLETE 4+ VIEW

[l-spine ap]
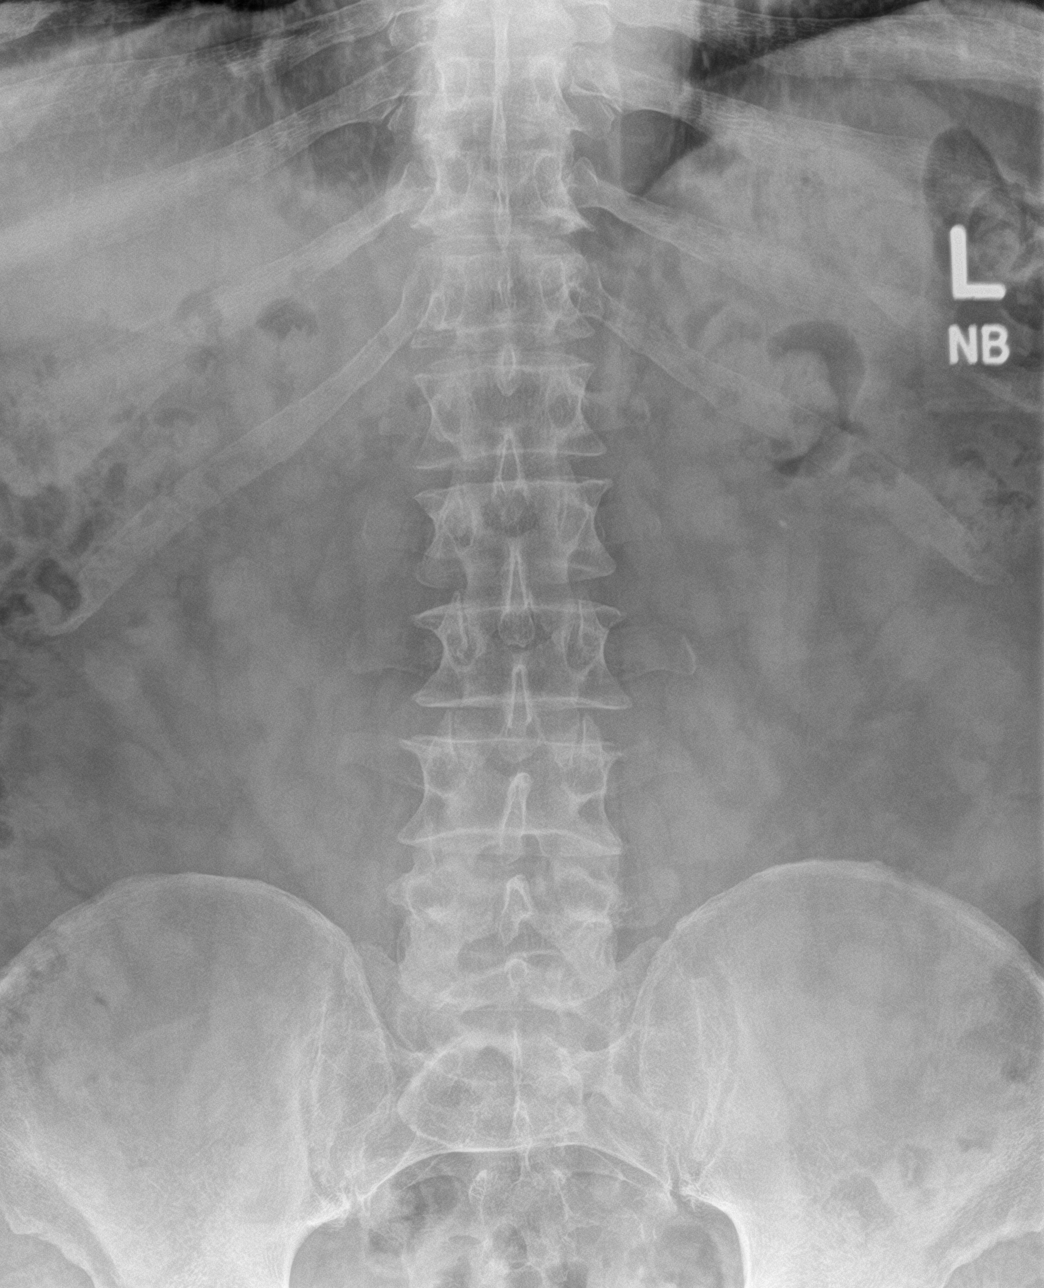

[l-spine obl (1 of 2)]
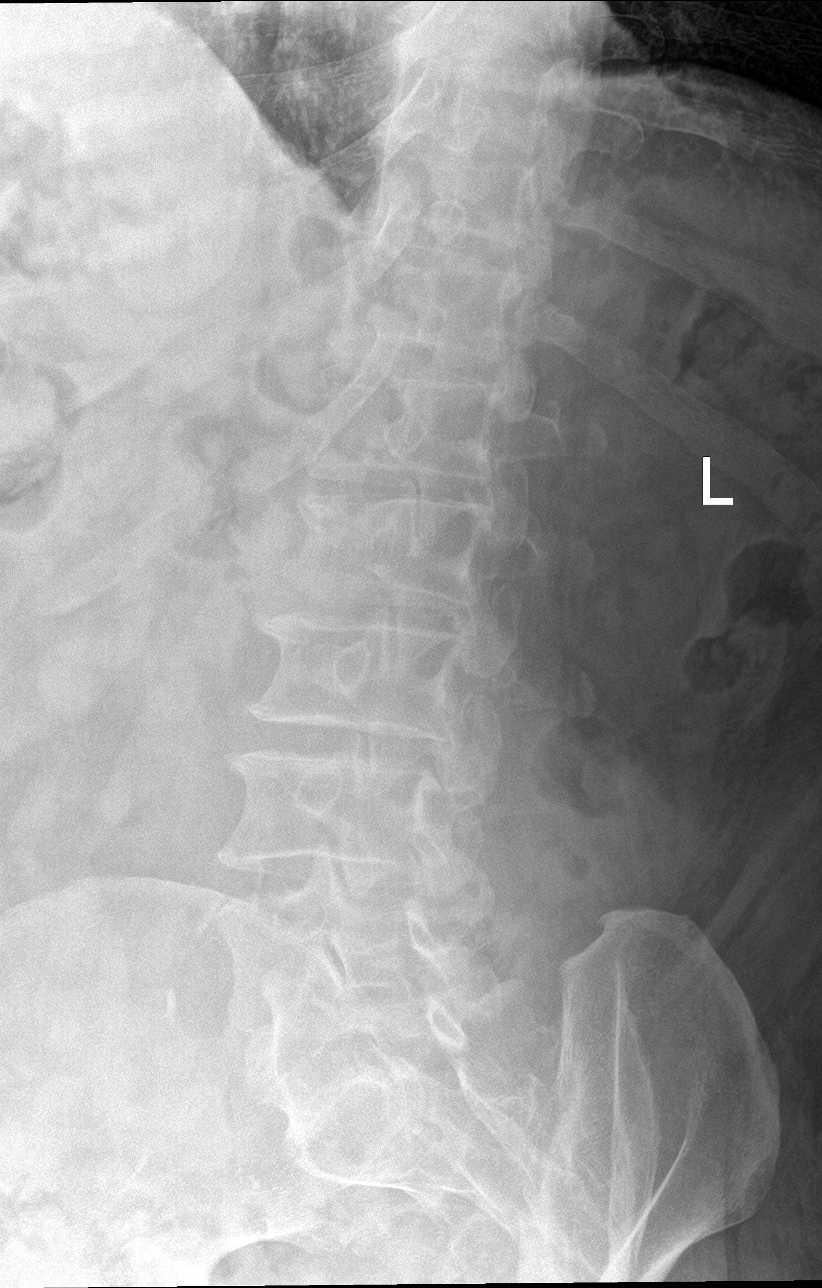

[l-spine obl (2 of 2)]
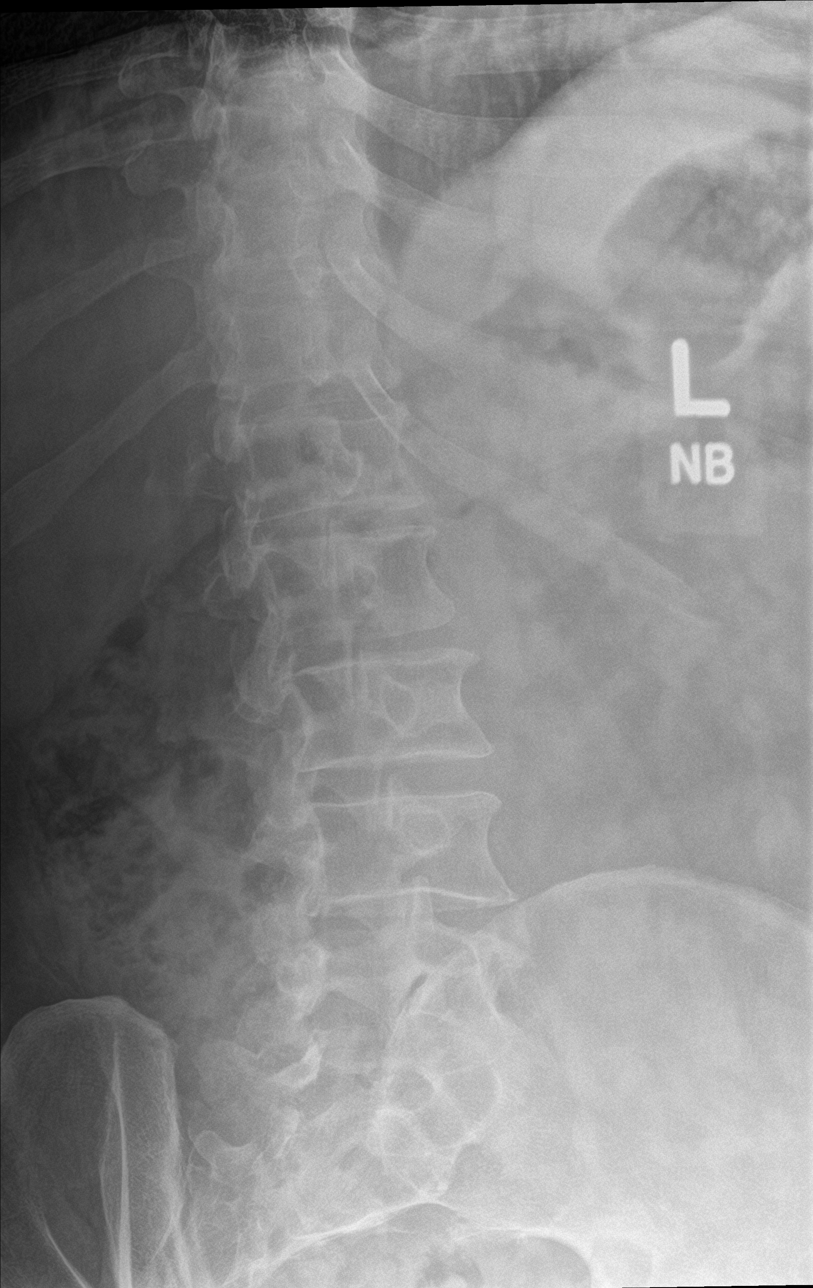

[l-spine lat]
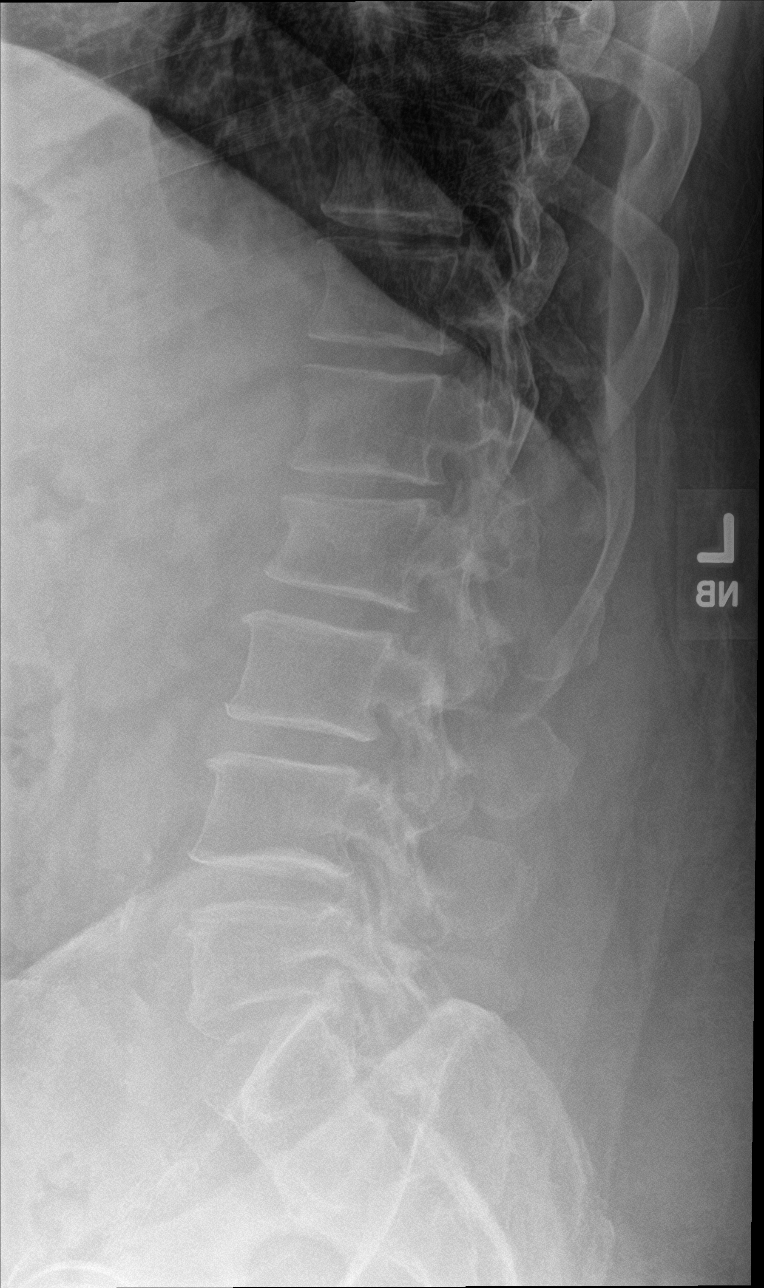

[l-spine spot]
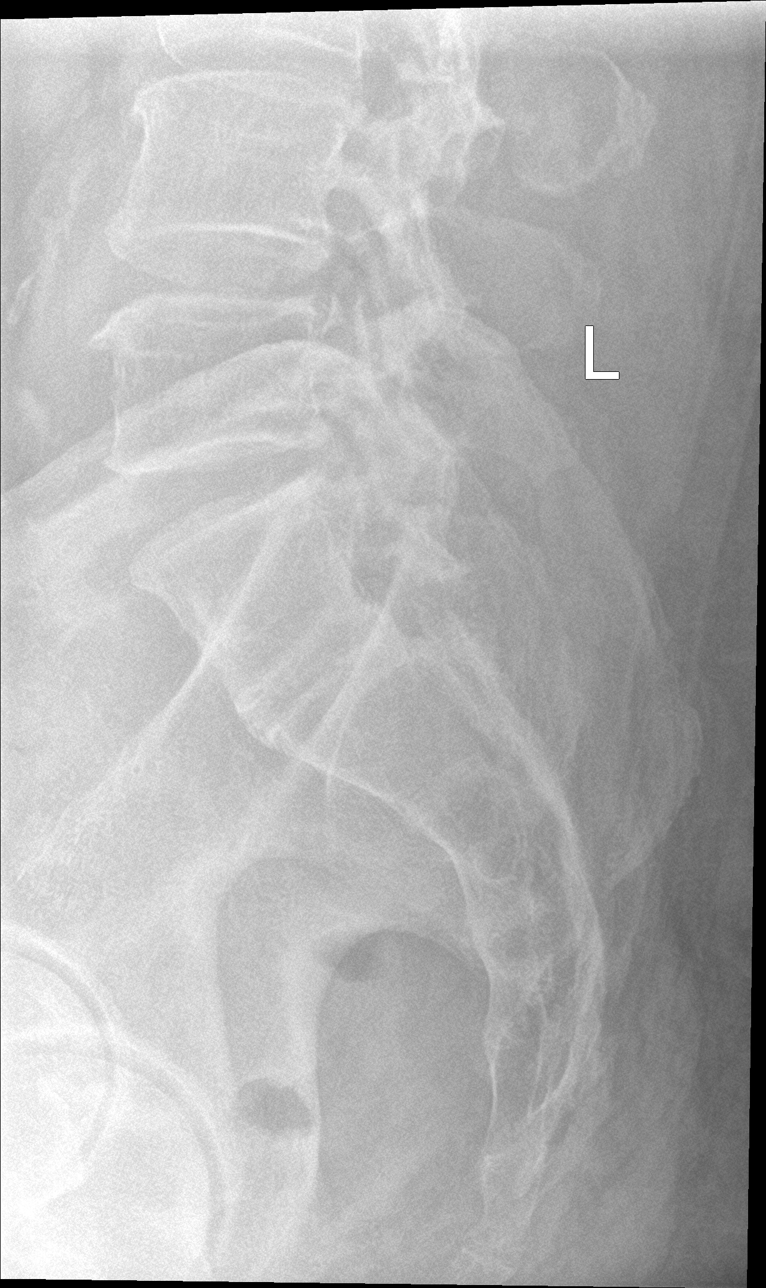

[5 of 5 positions shown; findings below may reference images not displayed]

FINDINGS: There is no evidence of an acute lumbar spine fracture. Chronic
appearing loss of vertebral body height is seen at the level of L2.
Alignment is normal. Mild to moderate severity endplate sclerosis is
seen at the level of L4-L5 and L5-S1. There is moderate severity
intervertebral disc space narrowing at L5-S1.
IMPRESSION: 1. No evidence of an acute lumbar spine fracture.
2. Chronic appearing loss of vertebral body height at the level of
L2.
3. Mild to moderate severity degenerative changes at L4-L5 and
L5-S1.

## 2023-09-06 ENCOUNTER — Encounter: Payer: Self-pay | Admitting: Sports Medicine

## 2023-09-16 ENCOUNTER — Ambulatory Visit: Payer: Self-pay

## 2023-09-16 NOTE — Telephone Encounter (Signed)
 FYI Only or Action Required?: Action required by provider: states was told not to take Tylenol products and would like to know what he can take for this discomfort.  Patient was last seen in primary care on 04/29/2023 by Willo Mini, NP.  Called Nurse Triage reporting Nasal Congestion.  Symptoms began several days ago.  Interventions attempted: Nothing.  Symptoms are: gradually worsening.  Triage Disposition: See PCP When Office is Open (Within 3 Days)  Patient/caregiver understands and will follow disposition?: Yes      Copied from CRM [id number]. Topic: Clinical - Red Word Triage >> Sep 16, 2023  9:57 AM Mercer PEDLAR wrote: Red Word that prompted transfer to Nurse Triage: Extreme fatigue, dizziness, congestion, fever. Patient wants to know what medication he can take. Reason for Disposition  [1] Sinus congestion (pressure, fullness) AND [2] present > 10 days  Answer Assessment - Initial Assessment Questions 1. LOCATION: Where does it hurt?      Head, sinus pressure 2. ONSET: When did the sinus pain start?  (e.g., hours, days)      Tue or Wed 3. SEVERITY: How bad is the pain?   (Scale 0-10; or none, mild, moderate or severe)     States more weakness but does have a headache 4. RECURRENT SYMPTOM: Have you ever had sinus problems before? If Yes, ask: When was the last time? and What happened that time?      yes 5. NASAL CONGESTION: Is the nose blocked? If Yes, ask: Can you open it or must you breathe through your mouth?     blocked 6. NASAL DISCHARGE: Do you have discharge from your nose? If so ask, What color?     yellowish 7. FEVER: Do you have a fever? If Yes, ask: What is it, how was it measured, and when did it start?      Slight fever 8. OTHER SYMPTOMS: Do you have any other symptoms? (e.g., sore throat, cough, earache, difficulty breathing)     Weak and tired 9. PREGNANCY: Is there any chance you are pregnant? When was your last menstrual  period?     na  Protocols used: Sinus Pain or Congestion-A-AH

## 2023-09-19 ENCOUNTER — Ambulatory Visit (INDEPENDENT_AMBULATORY_CARE_PROVIDER_SITE_OTHER): Payer: Self-pay | Admitting: Medical-Surgical

## 2023-09-19 ENCOUNTER — Encounter: Payer: Self-pay | Admitting: Medical-Surgical

## 2023-09-19 VITALS — BP 133/74 | HR 80 | Temp 98.8°F | Resp 20 | Ht 72.0 in | Wt 288.0 lb

## 2023-09-19 DIAGNOSIS — H6501 Acute serous otitis media, right ear: Secondary | ICD-10-CM

## 2023-09-19 DIAGNOSIS — J441 Chronic obstructive pulmonary disease with (acute) exacerbation: Secondary | ICD-10-CM

## 2023-09-19 DIAGNOSIS — F324 Major depressive disorder, single episode, in partial remission: Secondary | ICD-10-CM

## 2023-09-19 DIAGNOSIS — R748 Abnormal levels of other serum enzymes: Secondary | ICD-10-CM

## 2023-09-19 MED ORDER — BENZONATATE 200 MG PO CAPS: 200.0000 mg | ORAL_CAPSULE | Freq: Two times a day (BID) | ORAL | 3 refills | Status: AC | PRN

## 2023-09-19 MED ORDER — AZITHROMYCIN 250 MG PO TABS: ORAL_TABLET | ORAL | 0 refills | Status: AC

## 2023-09-19 MED ORDER — HYDROCODONE BIT-HOMATROP MBR 5-1.5 MG/5ML PO SOLN: 5.0000 mL | Freq: Four times a day (QID) | ORAL | 0 refills | Status: AC | PRN

## 2023-09-19 NOTE — Progress Notes (Signed)
 Established patient visit   History of Present Illness   Discussed the use of AI scribe software for clinical note transcription with the patient, who gave verbal consent to proceed.  History of Present Illness   Eric Baird is a 63 year old male with COPD who presents with congestion and fatigue.  Upper respiratory symptoms - Congestion and significant fatigue since last Wednesday - Fatigue is particularly pronounced - Cough present, productive but sputum not examined - Low-grade fever for one day, no chills or sore throat - Nausea without vomiting or other gastrointestinal symptoms - COVID-19 test on Thursday was negative - No medications taken for these symptoms due to concerns about liver enzyme interactions  Chronic obstructive pulmonary disease (copd) management - History of COPD - Difficulty obtaining inhalers due to Medicare coverage issues (has parts A and B, not part D) - Uses a non-prescription ('fake Symbicort ') inhaler purchased independently  Medication use and concerns - Currently taking bupropion  300 mg once daily - History of fluctuating liver enzymes, leading to caution with new medications  Psychosocial stressors - Recent job reshuffle resulting in 30% income reduction after 21 years of employment - Did not work on Thursday or Friday to avoid exposing boss's newborn grandchild to illness      Physical Exam   Physical Exam Vitals and nursing note reviewed.  Constitutional:      General: He is not in acute distress.    Appearance: Normal appearance.  HENT:     Head: Normocephalic and atraumatic.     Right Ear: External ear normal. There is no impacted cerumen. Tympanic membrane is erythematous.     Left Ear: Tympanic membrane, ear canal and external ear normal. There is no impacted cerumen.     Nose: Congestion present. No rhinorrhea.     Mouth/Throat:     Mouth: Mucous membranes are moist.     Pharynx: Posterior oropharyngeal erythema  present.  Eyes:     Extraocular Movements: Extraocular movements intact.     Conjunctiva/sclera: Conjunctivae normal.     Pupils: Pupils are equal, round, and reactive to light.  Cardiovascular:     Rate and Rhythm: Normal rate and regular rhythm.     Pulses: Normal pulses.     Heart sounds: Normal heart sounds. No murmur heard.    No friction rub. No gallop.  Pulmonary:     Effort: Pulmonary effort is normal. No respiratory distress.     Breath sounds: Normal breath sounds.  Musculoskeletal:     Cervical back: Neck supple.  Lymphadenopathy:     Cervical: No cervical adenopathy.  Skin:    General: Skin is warm and dry.  Neurological:     Mental Status: He is alert and oriented to person, place, and time.  Psychiatric:        Mood and Affect: Mood normal.        Behavior: Behavior normal.        Thought Content: Thought content normal.        Judgment: Judgment normal.    Assessment & Plan   Assessment and Plan    COPD with acute exacerbation COPD exacerbation likely viral. High risk for bacterial infection due to occupational exposure and history. Negative COVID test. - Prescribed azithromycin  (Z-Pak). - Refilled Tessalon  Perles and cough syrup. - Advised Tylenol use, max 1000 mg TID. - Recommended Coricidin or NyQuil/Dayquil HBP for symptom management.  Acute serous otitis media of the right ear Acute otitis media  with erythematous ear canal and dull, erythematous TM. No significant pain. - Included azithromycin  (Z-Pak) for bacterial coverage.  Abnormal liver enzymes Fluctuating liver enzymes with recent improvement. Last AST was 63 in May. - Advised Tylenol use with caution, max 1000 mg TID.  Major depressive disorder, with single episode, in partial remission Depression managed with bupropion  300 mg daily. Recent stress due to job changes and income reduction. - Continue bupropion  300 mg daily.  Medication coverage and costs Discussed medication coverage and  costs due to Medicare Part A and B limitations. - Check with pharmacy regarding inhaler costs.     Follow up   Return if symptoms worsen or fail to improve. __________________________________ Zada FREDRIK Palin, DNP, APRN, FNP-BC Primary Care and Sports Medicine [facility]

## 2023-09-19 NOTE — Patient Instructions (Signed)
 OTC medications for symptom management:  Coricidin daytime/nighttime Nyquil HBP  Dayquil HBP  Tessalon  perls Hycodan cough syrup

## 2023-09-19 NOTE — Telephone Encounter (Signed)
 Patient seen this morning with Zada Palin, NP

## 2023-11-21 ENCOUNTER — Ambulatory Visit: Payer: Self-pay

## 2023-11-21 NOTE — Telephone Encounter (Signed)
 FYI Only or Action Required?: FYI only for provider: appointment scheduled on Tomorrow.  Patient was last seen in primary care on 09/19/2023 by Willo Mini, NP.  Called Nurse Triage reporting No chief complaint on file..  Symptoms began about a month ago.  Interventions attempted: Rest, hydration, or home remedies.  Symptoms are: gradually worsening.  Triage Disposition: See PCP When Office is Open (Within 3 Days)  Patient/caregiver understands and will follow disposition?: Yes  Copied from CRM #[id number]. Topic: Clinical - Red Word Triage >> Nov 21, 2023 12:10 PM Rosaria A wrote: [facility] that prompted transfer to Nurse Triage: Patient left leg is sore and can barely walk on it. Cannot tell it is swollen or not. Reason for Disposition  [1] MODERATE pain (e.g., interferes with normal activities, limping) AND [2] present > 3 days  Answer Assessment - Initial Assessment Questions 1. ONSET: When did the pain start?      A month ago  2. LOCATION: Where is the pain located?      Behind your knee, Left leg  3. PAIN: How bad is the pain?    (Scale 1-10; or mild, moderate, severe)     Moderate to Severe  4. WORK OR EXERCISE: Has there been any recent work or exercise that involved this part of the body?      Denies  5. CAUSE: What do you think is causing the leg pain?     Unsure  6. OTHER SYMPTOMS: Do you have any other symptoms? (e.g., chest pain, back pain, breathing difficulty, swelling, rash, fever, numbness, weakness)     Swelling, Blotchy  Protocols used: Leg Pain-A-AH

## 2023-11-22 ENCOUNTER — Encounter: Payer: Self-pay | Admitting: Medical-Surgical

## 2023-11-22 ENCOUNTER — Ambulatory Visit: Payer: Self-pay | Admitting: Medical-Surgical

## 2023-11-22 VITALS — BP 111/69 | HR 78 | Resp 20 | Ht 72.0 in | Wt 291.0 lb

## 2023-11-22 DIAGNOSIS — M25562 Pain in left knee: Secondary | ICD-10-CM

## 2023-11-22 DIAGNOSIS — M79605 Pain in left leg: Secondary | ICD-10-CM

## 2023-11-22 MED ORDER — HYDROCODONE-ACETAMINOPHEN 5-325 MG PO TABS: 1.0000 | ORAL_TABLET | Freq: Three times a day (TID) | ORAL | 0 refills | Status: AC | PRN

## 2023-11-22 MED ORDER — CELECOXIB 200 MG PO CAPS: ORAL_CAPSULE | ORAL | 2 refills | Status: DC

## 2023-11-22 NOTE — Progress Notes (Signed)
 On evaluation, patient reports the painful area of the left knee is on the lateral joint line extending to the popliteal area. With 1-2+ pitting edema present, concern for DVT. No anterior knee or lateral knee pain.   Medical screening examination/treatment was performed by qualified clinical staff member and as supervising provider I was immediately available for consultation/collaboration. I have reviewed documentation and agree with assessment and plan.  Zada FREDRIK Palin, DNP, APRN, FNP-BC [facility] and Sports Medicine

## 2023-11-22 NOTE — Progress Notes (Signed)
   Acute Office Visit  Subjective:     Patient ID: Eric Baird, male    DOB: Apr 20, 1960, 63 y.o.   MRN: [id number]  Chief Complaint  Patient presents with   Knee Pain    Left   Leg Pain    Left     Patient is in today for left thigh and knee pain that started Sunday. He states that he was unable to bear weight on his left leg. Thigh was red. but denies known selling, or increased numbness or tingling. Pain was sudden that worsened into Monday. Initial pain level was 10/10, but pain now is 7/10. He has tried taking tylenol 1000 mg with no relief. He reports having a previous episode about a month ago which resolved after 1 day. Denies having any injury, recent travel, painful to touch, or shortness of breath.  Review of Systems  Constitutional: Negative.   HENT: Negative.    Eyes: Negative.   Respiratory: Negative.    Cardiovascular: Negative.   Gastrointestinal: Negative.   Genitourinary: Negative.   Musculoskeletal:  Positive for joint pain.       Left knee Pain  Skin: Negative.   Neurological:  Positive for tingling.  Endo/Heme/Allergies: Negative.   Psychiatric/Behavioral: Negative.          Objective:    BP 111/69 (BP Location: Left Arm, Cuff Size: Normal)   Pulse 78   Resp 20   Ht 6' (1.829 m)   Wt 132 kg   SpO2 96%   BMI 39.47 kg/m  BP Readings from Last 3 Encounters:  11/22/23 111/69  09/19/23 133/74  05/13/23 116/77    Physical Exam Vitals and nursing note reviewed.  Constitutional:      General: He is not in acute distress.    Appearance: Normal appearance.  Cardiovascular:     Rate and Rhythm: Normal rate and regular rhythm.     Pulses: Normal pulses.     Heart sounds: Normal heart sounds.  Pulmonary:     Effort: Pulmonary effort is normal.     Breath sounds: Normal breath sounds.  Musculoskeletal:     Left knee: Swelling and erythema present. Tenderness present.       Legs:  Neurological:     General: No focal deficit present.      Mental Status: He is alert and oriented to person, place, and time.  Psychiatric:        Mood and Affect: Mood normal.        Behavior: Behavior normal.        Thought Content: Thought content normal.        Judgment: Judgment normal.     No results found for any visits on 11/22/23.      Assessment & Plan:   1. Left leg pain (Primary) 2. Posterior left knee pain - US  of lower left limb to rule out DVT - Will consider additional imaging if needed, if negative US  - US  Venous Img Lower Unilateral Left; Future    Return if symptoms worsen or fail to improve.  Derrek JINNY Freund, NP Student

## 2023-11-23 ENCOUNTER — Ambulatory Visit: Payer: Self-pay | Admitting: Medical-Surgical

## 2023-11-23 ENCOUNTER — Ambulatory Visit

## 2023-11-23 DIAGNOSIS — M79605 Pain in left leg: Secondary | ICD-10-CM | POA: Diagnosis not present

## 2024-01-31 ENCOUNTER — Encounter: Payer: Self-pay | Admitting: Medical-Surgical

## 2024-01-31 MED ORDER — NICOTINE 21 MG/24HR TD PT24: 21.0000 mg | MEDICATED_PATCH | Freq: Every day | TRANSDERMAL | 0 refills | Status: AC

## 2024-02-01 ENCOUNTER — Other Ambulatory Visit: Payer: Self-pay | Admitting: Medical-Surgical

## 2024-02-01 ENCOUNTER — Telehealth: Payer: Self-pay

## 2024-02-01 NOTE — Telephone Encounter (Signed)
 Request received for refill of breyna  160/4.5 Med not showing in current med list  and did not find this in past med list either Last OV 11/18/225 Knee pain Upcoming appt 02/07/2024 hospital follow up

## 2024-02-01 NOTE — Transitions of Care (Post Inpatient/ED Visit) (Signed)
 "  02/01/2024  Name: Eric Baird MRN: [id number] DOB: 1960/03/17  Today's TOC FU Call Status: Today's TOC FU Call Status:: Successful TOC FU Call Completed TOC FU Call Complete Date: 02/01/24  Patient's Name and Date of Birth confirmed. Name, DOB  Transition Care Management Follow-up Telephone Call Date of Discharge: 01/31/24 Discharge Facility: Other ([facility]) Name of Other ([facility]) Discharge Facility: [facility] Type of Discharge: Inpatient Admission How have you been since you were released from the hospital?: Better Any questions or concerns?: No  Items Reviewed: Did you receive and understand the discharge instructions provided?: Yes Medications obtained,verified, and reconciled?: Yes (Medications Reviewed) Any new allergies since your discharge?: No Dietary orders reviewed?: Yes Do you have support at home?: Yes People in Home [RPT]: spouse  Medications Reviewed Today: Medications Reviewed Today     Reviewed by Emmitt Pan, LPN (Licensed Practical Nurse) on 02/01/24 at 0902  Med List Status: <None>   Medication Order Taking? Sig Documenting Provider Last Dose Status Informant  albuterol  (VENTOLIN  HFA) 108 (90 Base) MCG/ACT inhaler [id number] Yes Inhale 1-2 puffs into the lungs every 4 (four) hours as needed for wheezing or shortness of breath. PLEASE RUN W/ [employer] COUPON Willo Mini, NP  Active   Albuterol -Budesonide  (AIRSUPRA ) 90-80 MCG/ACT AERO [id number] Yes Inhale 2 puffs into the lungs every 4 (four) hours as needed. Willo Mini, NP  Active   atorvastatin  (LIPITOR) 20 MG tablet [id number] Yes Take 1 tablet (20 mg total) by mouth daily. Willo Mini, NP  Active   benzonatate  (TESSALON ) 200 MG capsule [id number] Yes Take 1 capsule (200 mg total) by mouth 2 (two) times daily as needed for cough. Willo Mini, NP  Active   budesonide -formoterol  (SYMBICORT ) 160-4.5 MCG/ACT inhaler [id number] Yes Inhale 2 puffs into the lungs 2 (two) times daily. Willo Mini, NP   Active   buPROPion  (WELLBUTRIN  XL) 300 MG 24 hr tablet [id number] Yes Take 1 tablet (300 mg total) by mouth daily. Willo Mini, NP  Active   diclofenac  (VOLTAREN ) 75 MG EC tablet [id number] Yes Take 1 tablet (75 mg total) by mouth 2 (two) times daily. DO NOT TAKE IBUPROFEN  PRODUCTS WHILE ON THIS MEDICATION. STOP MOBIC . Willo Mini, NP  Active   HYDROcodone  bit-homatropine (HYCODAN) 5-1.5 MG/5ML syrup [id number] Yes Take 5 mLs by mouth every 6 (six) hours as needed for cough. Willo Mini, NP  Active   HYDROcodone -acetaminophen  (NORCO/VICODIN) 5-325 MG tablet [id number] Yes Take 1 tablet by mouth every 8 (eight) hours as needed for moderate pain (pain score 4-6). Willo Mini, NP  Active   lisinopril  (ZESTRIL ) 20 MG tablet [id number] Yes Take 1 tablet (20 mg total) by mouth daily. Willo Mini, NP  Active   nicotine  (NICODERM CQ  - DOSED IN MG/24 HOURS) 21 mg/24hr patch [id number] Yes Place 1 patch (21 mg total) onto the skin daily. Willo Mini, NP  Active   omeprazole  (PRILOSEC) 20 MG capsule [id number] Yes Take 1 capsule (20 mg total) by mouth daily. Willo Mini, NP  Active   traZODone  (DESYREL ) 50 MG tablet [id number] Yes Take 1 tablet (50 mg total) by mouth at bedtime. Willo Mini, NP  Active             Home Care and Equipment/Supplies: Were Home Health Services Ordered?: NA Any new equipment or medical supplies ordered?: NA  Functional Questionnaire: Do you need assistance with bathing/showering or dressing?: No Do you need assistance with meal preparation?: No Do you need assistance with eating?: No Do you have difficulty maintaining continence:  No Do you need assistance with getting out of bed/getting out of a chair/moving?: No Do you have difficulty managing or taking your medications?: No  Follow up appointments reviewed: PCP Follow-up appointment confirmed?: Yes Date of PCP follow-up appointment?: 02/07/24 Follow-up Provider: [facility] Follow-up appointment confirmed?:  NA Do you need transportation to your follow-up appointment?: No Do you understand care options if your condition(s) worsen?: Yes-patient verbalized understanding    SIGNATURE Julian Lemmings, LPN [facility] Nurse Health Advisor Direct Dial [phone]  "

## 2024-02-01 NOTE — Telephone Encounter (Signed)
 Refill request denied.  Patient has Symbicort  which has the same active ingredients on his med list and should be using that.

## 2024-02-07 ENCOUNTER — Ambulatory Visit: Admitting: Medical-Surgical

## 2024-02-07 ENCOUNTER — Encounter: Payer: Self-pay | Admitting: Medical-Surgical

## 2024-02-07 VITALS — BP 112/71 | HR 68 | Resp 20 | Ht 72.0 in | Wt 287.0 lb

## 2024-02-07 DIAGNOSIS — Z950 Presence of cardiac pacemaker: Secondary | ICD-10-CM | POA: Diagnosis not present

## 2024-02-07 DIAGNOSIS — Z09 Encounter for follow-up examination after completed treatment for conditions other than malignant neoplasm: Secondary | ICD-10-CM | POA: Diagnosis not present

## 2024-02-07 DIAGNOSIS — L03113 Cellulitis of right upper limb: Secondary | ICD-10-CM | POA: Diagnosis not present

## 2024-02-07 DIAGNOSIS — J449 Chronic obstructive pulmonary disease, unspecified: Secondary | ICD-10-CM

## 2024-02-07 DIAGNOSIS — F17200 Nicotine dependence, unspecified, uncomplicated: Secondary | ICD-10-CM | POA: Diagnosis not present

## 2024-02-07 MED ORDER — BUDESONIDE-FORMOTEROL FUMARATE 160-4.5 MCG/ACT IN AERO: 2.0000 | INHALATION_SPRAY | Freq: Two times a day (BID) | RESPIRATORY_TRACT | 12 refills | Status: AC

## 2024-02-07 MED ORDER — AMOXICILLIN-POT CLAVULANATE 875-125 MG PO TABS: 1.0000 | ORAL_TABLET | Freq: Two times a day (BID) | ORAL | 0 refills | Status: AC

## 2024-02-07 MED ORDER — ALBUTEROL SULFATE HFA 108 (90 BASE) MCG/ACT IN AERS: 1.0000 | INHALATION_SPRAY | RESPIRATORY_TRACT | 99 refills | Status: AC | PRN

## 2024-06-14 ENCOUNTER — Ambulatory Visit

## 2024-08-06 ENCOUNTER — Ambulatory Visit: Admitting: Medical-Surgical
